# Patient Record
Sex: Female | Born: 1935 | Race: White | Hispanic: No | Marital: Married | Smoking: Never smoker
Health system: Southern US, Community
[De-identification: ages and names within clinical notes are randomized; demographics above are authoritative.]

## PROBLEM LIST (undated history)

## (undated) DIAGNOSIS — Z471 Aftercare following joint replacement surgery: Secondary | ICD-10-CM

## (undated) DIAGNOSIS — R002 Palpitations: Secondary | ICD-10-CM

## (undated) DIAGNOSIS — C4490 Unspecified malignant neoplasm of skin, unspecified: Secondary | ICD-10-CM

## (undated) DIAGNOSIS — E7800 Pure hypercholesterolemia, unspecified: Secondary | ICD-10-CM

## (undated) DIAGNOSIS — Z96652 Presence of left artificial knee joint: Secondary | ICD-10-CM

## (undated) DIAGNOSIS — N200 Calculus of kidney: Secondary | ICD-10-CM

## (undated) DIAGNOSIS — R079 Chest pain, unspecified: Secondary | ICD-10-CM

## (undated) DIAGNOSIS — I10 Essential (primary) hypertension: Secondary | ICD-10-CM

## (undated) DIAGNOSIS — M1990 Unspecified osteoarthritis, unspecified site: Secondary | ICD-10-CM

## (undated) DIAGNOSIS — L570 Actinic keratosis: Secondary | ICD-10-CM

## (undated) DIAGNOSIS — M169 Osteoarthritis of hip, unspecified: Secondary | ICD-10-CM

## (undated) HISTORY — DX: Actinic keratosis: L57.0

## (undated) HISTORY — DX: Palpitations: R00.2

## (undated) HISTORY — PX: APPENDECTOMY: SHX54

## (undated) HISTORY — DX: Aftercare following joint replacement surgery: Z47.1

## (undated) HISTORY — DX: Chest pain, unspecified: R07.9

## (undated) HISTORY — PX: TONSILLECTOMY: SUR1361

## (undated) HISTORY — DX: Pure hypercholesterolemia, unspecified: E78.00

## (undated) HISTORY — PX: SHOULDER ARTHROSCOPY: SHX128

## (undated) HISTORY — DX: Presence of left artificial knee joint: Z96.652

## (undated) HISTORY — PX: TUBAL LIGATION: SHX77

## (undated) HISTORY — PX: ANKLE SURGERY: SHX546

## (undated) HISTORY — DX: Unspecified malignant neoplasm of skin, unspecified: C44.90

---

## 2005-09-28 ENCOUNTER — Ambulatory Visit: Payer: Self-pay | Admitting: Family Medicine

## 2006-05-04 ENCOUNTER — Ambulatory Visit: Payer: Self-pay | Admitting: Unknown Physician Specialty

## 2006-10-01 DIAGNOSIS — C4492 Squamous cell carcinoma of skin, unspecified: Secondary | ICD-10-CM

## 2006-10-01 HISTORY — DX: Squamous cell carcinoma of skin, unspecified: C44.92

## 2006-10-13 ENCOUNTER — Inpatient Hospital Stay: Payer: Self-pay | Admitting: Internal Medicine

## 2006-10-13 ENCOUNTER — Other Ambulatory Visit: Payer: Self-pay

## 2006-11-14 ENCOUNTER — Ambulatory Visit: Payer: Self-pay | Admitting: Unknown Physician Specialty

## 2007-11-19 ENCOUNTER — Ambulatory Visit: Payer: Self-pay | Admitting: Unknown Physician Specialty

## 2007-11-25 ENCOUNTER — Ambulatory Visit: Payer: Self-pay | Admitting: Unknown Physician Specialty

## 2008-01-07 DIAGNOSIS — C44722 Squamous cell carcinoma of skin of right lower limb, including hip: Secondary | ICD-10-CM

## 2008-01-07 HISTORY — DX: Squamous cell carcinoma of skin of right lower limb, including hip: C44.722

## 2008-12-14 ENCOUNTER — Ambulatory Visit: Payer: Self-pay | Admitting: Unknown Physician Specialty

## 2009-01-13 ENCOUNTER — Ambulatory Visit: Payer: Self-pay | Admitting: Unknown Physician Specialty

## 2009-04-02 ENCOUNTER — Ambulatory Visit: Payer: Self-pay | Admitting: Unknown Physician Specialty

## 2009-07-28 ENCOUNTER — Ambulatory Visit: Payer: Self-pay | Admitting: Specialist

## 2010-01-04 ENCOUNTER — Ambulatory Visit: Payer: Self-pay | Admitting: Unknown Physician Specialty

## 2010-04-25 ENCOUNTER — Ambulatory Visit: Payer: Self-pay | Admitting: General Practice

## 2010-05-09 ENCOUNTER — Ambulatory Visit: Payer: Self-pay | Admitting: General Practice

## 2010-05-16 ENCOUNTER — Inpatient Hospital Stay: Payer: Self-pay | Admitting: General Practice

## 2010-05-18 LAB — PATHOLOGY REPORT

## 2010-06-05 ENCOUNTER — Encounter: Payer: Self-pay | Admitting: Internal Medicine

## 2010-06-07 ENCOUNTER — Encounter: Payer: Self-pay | Admitting: Internal Medicine

## 2012-08-26 DIAGNOSIS — C44729 Squamous cell carcinoma of skin of left lower limb, including hip: Secondary | ICD-10-CM

## 2012-08-26 HISTORY — DX: Squamous cell carcinoma of skin of left lower limb, including hip: C44.729

## 2012-11-28 ENCOUNTER — Ambulatory Visit: Payer: Self-pay | Admitting: Unknown Physician Specialty

## 2013-03-13 ENCOUNTER — Ambulatory Visit: Payer: Self-pay | Admitting: Unknown Physician Specialty

## 2013-06-19 DIAGNOSIS — Z471 Aftercare following joint replacement surgery: Secondary | ICD-10-CM

## 2013-06-19 HISTORY — DX: Aftercare following joint replacement surgery: Z47.1

## 2014-02-06 HISTORY — PX: JOINT REPLACEMENT: SHX530

## 2014-04-14 DIAGNOSIS — Z96652 Presence of left artificial knee joint: Secondary | ICD-10-CM

## 2014-04-14 HISTORY — DX: Presence of left artificial knee joint: Z96.652

## 2014-07-07 DIAGNOSIS — E7800 Pure hypercholesterolemia, unspecified: Secondary | ICD-10-CM | POA: Insufficient documentation

## 2014-07-07 HISTORY — DX: Pure hypercholesterolemia, unspecified: E78.00

## 2014-09-12 DIAGNOSIS — L03116 Cellulitis of left lower limb: Secondary | ICD-10-CM | POA: Diagnosis not present

## 2014-09-12 NOTE — ED Notes (Signed)
Pt says she had an area of skin cancer removed on Tuesday from her left lower leg; done by dr Nehemiah Massed; pt says she noticed some redness this evening and started putting and antibiotic cream on it; pt concerned she is going to get MRSA

## 2014-09-13 ENCOUNTER — Emergency Department
Admission: EM | Admit: 2014-09-13 | Discharge: 2014-09-13 | Disposition: A | Discharge status: Home / Self Care | Payer: Medicare Other | Attending: Emergency Medicine | Admitting: Emergency Medicine

## 2014-09-13 DIAGNOSIS — L03116 Cellulitis of left lower limb: Secondary | ICD-10-CM

## 2014-09-13 MED ORDER — CLINDAMYCIN HCL 300 MG PO CAPS: 300.0000 mg | ORAL_CAPSULE | Freq: Four times a day (QID) | ORAL | Status: AC

## 2014-09-13 MED ORDER — CLINDAMYCIN HCL 150 MG PO CAPS
300.0000 mg | ORAL_CAPSULE | Freq: Once | ORAL | Status: AC
Start: 2014-09-13 — End: 2014-09-13
  Administered 2014-09-13: 300 mg via ORAL
  Filled 2014-09-13: qty 2

## 2014-09-13 NOTE — Discharge Instructions (Signed)
Please seek medical attention for any high fevers, chest pain, shortness of breath, change in behavior, persistent vomiting, bloody stool or any other new or concerning symptoms.  Cellulitis Cellulitis is an infection of the skin and the tissue beneath it. The infected area is usually red and tender. Cellulitis occurs most often in the arms and lower legs.  CAUSES  Cellulitis is caused by bacteria that enter the skin through cracks or cuts in the skin. The most common types of bacteria that cause cellulitis are staphylococci and streptococci. SIGNS AND SYMPTOMS   Redness and warmth.  Swelling.  Tenderness or pain.  Fever. DIAGNOSIS  Your health care provider can usually determine what is wrong based on a physical exam. Blood tests may also be done. TREATMENT  Treatment usually involves taking an antibiotic medicine. HOME CARE INSTRUCTIONS   Take your antibiotic medicine as directed by your health care provider. Finish the antibiotic even if you start to feel better.  Keep the infected arm or leg elevated to reduce swelling.  Apply a warm cloth to the affected area up to 4 times per day to relieve pain.  Take medicines only as directed by your health care provider.  Keep all follow-up visits as directed by your health care provider. SEEK MEDICAL CARE IF:   You notice red streaks coming from the infected area.  Your red area gets larger or turns dark in color.  Your bone or joint underneath the infected area becomes painful after the skin has healed.  Your infection returns in the same area or another area.  You notice a swollen bump in the infected area.  You develop new symptoms.  You have a fever. SEEK IMMEDIATE MEDICAL CARE IF:   You feel very sleepy.  You develop vomiting or diarrhea.  You have a general ill feeling (malaise) with muscle aches and pains. MAKE SURE YOU:   Understand these instructions.  Will watch your condition.  Will get help right away  if you are not doing well or get worse. Document Released: 11/02/2004 Document Revised: 06/09/2013 Document Reviewed: 04/10/2011 [facility] Patient Information 2015 [place], [place]. This information is not intended to replace advice given to you by your health care provider. Make sure you discuss any questions you have with your health care provider.

## 2014-09-13 NOTE — ED Notes (Signed)
Patient with no complaints at this time. Respirations even and unlabored. Skin warm/dry. Discharge instructions reviewed with patient at this time. Patient given opportunity to voice concerns/ask questions. Patient discharged at this time and left Emergency Department with steady gait.   

## 2014-09-13 NOTE — ED Provider Notes (Signed)
[facility] Emergency Department Provider Note    ____________________________________________  Time seen: 0232  I have reviewed the triage vital signs and the nursing notes.   HISTORY  Chief Complaint Cellulitis   History limited by: Not Limited   HPI Tiffany Cole is a 81 y.o. female who presents to the emergency department today with concerns for left leg infection. Patient states that she had a skin cancer removed 5 days ago from her left leg. She has been trying topical antibiotics however today noticed some redness and mild discomfort around the site. She states that she has previously had a MRSA infection after a skin cancer removal. Patient is not complaining of any systemic signs such as fever, nausea, vomiting.     No past medical history on file.  There are no active problems to display for this patient.   No past surgical history on file.  No current outpatient prescriptions on file.  Allergies Review of patient's allergies indicates no known allergies.  No family history on file.  Social History No smoking Review of Systems  Constitutional: Negative for fever. Cardiovascular: Negative for chest pain. Respiratory: Negative for shortness of breath. Gastrointestinal: Negative for abdominal pain, vomiting and diarrhea. Genitourinary: Negative for dysuria. Musculoskeletal: Negative for back pain. Skin: Area of redness around the site of skin cancer. Neurological: Negative for headaches, focal weakness or numbness.   10-point ROS otherwise negative.  ____________________________________________   PHYSICAL EXAM:  VITAL SIGNS: ED Triage Vitals  Enc Vitals Group     BP 09/12/14 2343 171/75 mmHg     Pulse Rate 09/12/14 2343 77     Resp 09/12/14 2343 20     Temp 09/12/14 2343 98.6 F (37 C)     Temp Source 09/12/14 2343 Oral     SpO2 09/12/14 2343 98 %     Weight 09/12/14 2343 149 lb (67.586 kg)     Height 09/12/14 2343  5\' 5"  (1.651 m)     Head Cir --      Peak Flow --      Pain Score 09/12/14 2347 2   Constitutional: Alert and oriented. Well appearing and in no distress. Eyes: Conjunctivae are normal. PERRL. Normal extraocular movements. ENT   Head: Normocephalic and atraumatic.   Nose: No congestion/rhinnorhea.   Mouth/Throat: Mucous membranes are moist.   Neck: No stridor. Cardiovascular: Normal rate, regular rhythm.  No murmurs, rubs, or gallops. Respiratory: Normal respiratory effort without tachypnea nor retractions. Breath sounds are clear and equal bilaterally. No wheezes/rales/rhonchi. Genitourinary: Deferred Musculoskeletal: Normal range of motion in all extremities. No joint effusions.  No lower extremity tenderness nor edema. Neurologic:  Normal speech and language. No gross focal neurologic deficits are appreciated. Speech is normal.  Skin:  Skin is warm, dry and intact. Area of erythema and mild tenderness around a site of skin cancer excision on the left shin Psychiatric: Mood and affect are normal. Speech and behavior are normal. Patient exhibits appropriate insight and judgment.  ____________________________________________    LABS (pertinent positives/negatives)  None  ____________________________________________   EKG  None  ____________________________________________    RADIOLOGY  None  ____________________________________________   PROCEDURES  Procedure(s) performed: None  Critical Care performed: No  ____________________________________________   INITIAL IMPRESSION / ASSESSMENT AND PLAN / ED COURSE  Pertinent labs & imaging results that were available during my care of the patient were reviewed by me and considered in my medical decision making (see chart for details).  Patient presents to  the emergency department today with concerns for cellulitis around the side of his skin cancer excision. Exam is consistent with cellulitis. Will place  on antibiotics. Instructed patient follow-up with primary care doctor.  ____________________________________________   FINAL CLINICAL IMPRESSION(S) / ED DIAGNOSES  Final diagnoses:  Cellulitis of left lower extremity     Nance Pear, MD 09/13/14 [phone]

## 2014-09-17 MED ORDER — ACETAMINOPHEN 160 MG/5ML PO SUSP
ORAL | Status: AC
  Filled 2014-09-17: qty 5

## 2014-11-04 ENCOUNTER — Emergency Department
Admission: EM | Admit: 2014-11-04 | Discharge: 2014-11-04 | Disposition: A | Discharge status: Home / Self Care | Payer: Medicare Other | Attending: Emergency Medicine | Admitting: Emergency Medicine

## 2014-11-04 ENCOUNTER — Emergency Department: Payer: Medicare Other

## 2014-11-04 ENCOUNTER — Encounter: Payer: Self-pay | Admitting: Emergency Medicine

## 2014-11-04 DIAGNOSIS — R079 Chest pain, unspecified: Secondary | ICD-10-CM | POA: Diagnosis not present

## 2014-11-04 DIAGNOSIS — R0602 Shortness of breath: Secondary | ICD-10-CM | POA: Insufficient documentation

## 2014-11-04 DIAGNOSIS — R42 Dizziness and giddiness: Secondary | ICD-10-CM | POA: Diagnosis not present

## 2014-11-04 DIAGNOSIS — Z7982 Long term (current) use of aspirin: Secondary | ICD-10-CM | POA: Diagnosis not present

## 2014-11-04 LAB — CBC
HCT: 39.7 % (ref 35.0–47.0)
Hemoglobin: 13.6 g/dL (ref 12.0–16.0)
MCH: 33.1 pg (ref 26.0–34.0)
MCHC: 34.3 g/dL (ref 32.0–36.0)
MCV: 96.6 fL (ref 80.0–100.0)
Platelets: 232 10*3/uL (ref 150–440)
RBC: 4.11 MIL/uL (ref 3.80–5.20)
RDW: 12.8 % (ref 11.5–14.5)
WBC: 6.1 10*3/uL (ref 3.6–11.0)

## 2014-11-04 LAB — BASIC METABOLIC PANEL
Anion gap: 8 (ref 5–15)
BUN: 18 mg/dL (ref 6–20)
CO2: 28 mmol/L (ref 22–32)
Calcium: 9.5 mg/dL (ref 8.9–10.3)
Chloride: 104 mmol/L (ref 101–111)
Creatinine, Ser: 0.85 mg/dL (ref 0.44–1.00)
GFR calc Af Amer: >60 mL/min (ref >60)
GFR calc non Af Amer: >60 mL/min (ref >60)
Glucose, Bld: 106 mg/dL — ABNORMAL HIGH (ref 65–99)
Potassium: 4.1 mmol/L (ref 3.5–5.1)
Sodium: 140 mmol/L (ref 135–145)

## 2014-11-04 LAB — TROPONIN I: Troponin I: <0.03 ng/mL (ref <0.031)

## 2014-11-04 MED ORDER — LORAZEPAM 0.5 MG PO TABS: 0.5000 mg | ORAL_TABLET | Freq: Three times a day (TID) | ORAL | Status: AC | PRN

## 2014-11-04 NOTE — ED Notes (Signed)
Pt reports centralized chest pain today intermittently, reports taking a nitro this morning with some improvement. Pt reports shortness of breath and dizziness with episodes. Pt reports she's been under a lot of stress lately and is unsure if this is anxiety or not. Pt reports appt with Dr Josefa Half in the morning. Pt reports taking 81mg  of aspirin today.

## 2014-11-04 NOTE — Discharge Instructions (Signed)

## 2014-11-04 NOTE — ED Provider Notes (Signed)
[facility] Emergency Department Provider Note     Time seen: ----------------------------------------- 8:22 PM on 11/04/2014 -----------------------------------------    I have reviewed the triage vital signs and the nursing notes.   HISTORY  Chief Complaint Chest Pain and Dizziness    HPI Tiffany Cole is a 81 y.o. female who presents ER for central chest pain today that was intermittent. Patient reports taking her husband nitroglycerin this morning with some improvement perhaps in her symptoms. Associated with chest pain she had some shortness of breath and some dizziness. She notes she's been under lot of stress recently and she is unsure if this is anxiety or not. She does appear a point with her cardiologist tomorrow, she takes 81 mg of aspirin daily.   History reviewed. No pertinent past medical history.  There are no active problems to display for this patient.   Past Surgical History  Procedure Laterality Date  . Tonsillectomy    . Appendectomy    . Joint replacement    . Tubal ligation    . Ankle surgery      Allergies Review of patient's allergies indicates no known allergies.  Social History Social History  Substance Use Topics  . Smoking status: Never Smoker   . Smokeless tobacco: None  . Alcohol Use: No    Review of Systems Constitutional: Negative for fever. Eyes: Negative for visual changes. ENT: Negative for sore throat. Cardiovascular: Positive for chest pain Respiratory: Negative for shortness of breath. Gastrointestinal: Negative for abdominal pain, vomiting and diarrhea. Genitourinary: Negative for dysuria. Musculoskeletal: Negative for back pain. Skin: Negative for rash. Neurological: Negative for headaches, focal weakness or numbness.  10-point ROS otherwise negative.  ____________________________________________   PHYSICAL EXAM:  VITAL SIGNS: ED Triage Vitals  Enc Vitals Group     BP 11/04/14 1830  143/71 mmHg     Pulse Rate 11/04/14 1830 75     Resp 11/04/14 1830 20     Temp 11/04/14 1830 98.1 F (36.7 C)     Temp Source 11/04/14 1830 Oral     SpO2 11/04/14 1830 96 %     Weight 11/04/14 1830 148 lb (67.132 kg)     Height 11/04/14 1830 5\' 5"  (1.651 m)     Head Cir --      Peak Flow --      Pain Score 11/04/14 1832 0     Pain Loc --      Pain Edu? --      Excl. in [place]? --     Constitutional: Alert and oriented. Well appearing and in no distress. Anxious Eyes: Conjunctivae are normal. PERRL. Normal extraocular movements. ENT   Head: Normocephalic and atraumatic.   Nose: No congestion/rhinnorhea.   Mouth/Throat: Mucous membranes are moist.   Neck: No stridor. Cardiovascular: Normal rate, regular rhythm. Normal and symmetric distal pulses are present in all extremities. No murmurs, rubs, or gallops. Respiratory: Normal respiratory effort without tachypnea nor retractions. Breath sounds are clear and equal bilaterally. No wheezes/rales/rhonchi. Gastrointestinal: Soft and nontender. No distention. No abdominal bruits.  Musculoskeletal: Nontender with normal range of motion in all extremities. No joint effusions.  No lower extremity tenderness nor edema. Neurologic:  Normal speech and language. No gross focal neurologic deficits are appreciated. Speech is normal. No gait instability. Skin:  Skin is warm, dry and intact. No rash noted. Psychiatric: Mood and affect are normal. Speech and behavior are normal. Patient exhibits appropriate insight and judgment. Patient does appear anxious ____________________________________________  EKG: Interpreted by me. Sinus rhythm with a rate of 74 bpm, normal PR interval, normal QRS with, normal QT interval. There is evidence of LVH.  ____________________________________________  ED COURSE:  Pertinent labs & imaging results that were available during my care of the patient were reviewed by me and considered in my medical decision  making (see chart for details). Patient appears anxious, we'll check cardiac labs and chest x-ray. ____________________________________________    LABS (pertinent positives/negatives)  Labs Reviewed  BASIC METABOLIC PANEL - Abnormal; Notable for the following:    Glucose, Bld 106 (*)    All other components within normal limits  CBC  TROPONIN I    RADIOLOGY  Chest x-ray is unremarkable  ____________________________________________  FINAL ASSESSMENT AND PLAN  Chest pain  Plan: Patient with labs and imaging as dictated above. Symptoms seem clearly related to recent stress and anxiety. Her labs and x-ray are unremarkable. She is low risk for acute coronary syndrome. She is to see the cardiologist in the morning. We'll prescribe Ativan she can take as needed.   Earleen Newport, MD   Earleen Newport, MD 11/04/14 [phone]

## 2014-11-05 DIAGNOSIS — R079 Chest pain, unspecified: Secondary | ICD-10-CM

## 2014-11-05 HISTORY — DX: Chest pain, unspecified: R07.9

## 2014-12-01 DIAGNOSIS — R002 Palpitations: Secondary | ICD-10-CM

## 2014-12-01 HISTORY — DX: Palpitations: R00.2

## 2015-03-11 ENCOUNTER — Other Ambulatory Visit: Payer: Self-pay | Admitting: Gastroenterology

## 2015-03-11 DIAGNOSIS — R1310 Dysphagia, unspecified: Secondary | ICD-10-CM

## 2015-03-17 ENCOUNTER — Ambulatory Visit
Admission: RE | Admit: 2015-03-17 | Discharge: 2015-03-17 | Disposition: A | Discharge status: Home / Self Care | Payer: Medicare Other | Source: Ambulatory Visit | Attending: Gastroenterology | Admitting: Gastroenterology

## 2015-03-17 DIAGNOSIS — K228 Other specified diseases of esophagus: Secondary | ICD-10-CM | POA: Diagnosis not present

## 2015-03-17 DIAGNOSIS — K449 Diaphragmatic hernia without obstruction or gangrene: Secondary | ICD-10-CM | POA: Insufficient documentation

## 2015-03-17 DIAGNOSIS — R1310 Dysphagia, unspecified: Secondary | ICD-10-CM

## 2015-03-17 DIAGNOSIS — R1314 Dysphagia, pharyngoesophageal phase: Secondary | ICD-10-CM | POA: Insufficient documentation

## 2016-05-23 ENCOUNTER — Encounter: Payer: Self-pay | Admitting: Urology

## 2016-05-23 ENCOUNTER — Ambulatory Visit
Admission: RE | Admit: 2016-05-23 | Discharge: 2016-05-23 | Disposition: A | Discharge status: Home / Self Care | Payer: Medicare Other | Source: Ambulatory Visit | Attending: Urology | Admitting: Urology

## 2016-05-23 ENCOUNTER — Ambulatory Visit (INDEPENDENT_AMBULATORY_CARE_PROVIDER_SITE_OTHER): Payer: Medicare Other | Admitting: Urology

## 2016-05-23 VITALS — BP 92/53 | HR 80 | Ht 65.0 in | Wt 148.0 lb

## 2016-05-23 DIAGNOSIS — R109 Unspecified abdominal pain: Secondary | ICD-10-CM

## 2016-05-23 DIAGNOSIS — N200 Calculus of kidney: Secondary | ICD-10-CM | POA: Insufficient documentation

## 2016-05-23 DIAGNOSIS — N201 Calculus of ureter: Secondary | ICD-10-CM | POA: Diagnosis not present

## 2016-05-23 LAB — URINALYSIS, COMPLETE
Bilirubin, UA: NEGATIVE
Glucose, UA: NEGATIVE
Nitrite, UA: NEGATIVE
Specific Gravity, UA: 1.02 (ref 1.005–1.030)
Urobilinogen, Ur: 0.2 mg/dL (ref 0.2–1.0)
pH, UA: 5.5 (ref 5.0–7.5)

## 2016-05-23 LAB — MICROSCOPIC EXAMINATION: RBC, UA: >30 /hpf — ABNORMAL HIGH (ref 0–?)

## 2016-05-23 NOTE — Progress Notes (Signed)
05/23/2016 8:00 AM   Tiffany Cole May 02, 1928 [id number]  Referring provider: Dion Body, MD [name], [name] [address]  Cc: right flank pain  HPI: 83 year old actually healthy female who presents today for further evaluation of right flank pain. She has a personal history of recurrent nephrolithiasis and passes about a stone present year spontaneously. Today, she completed that she's had intermittent right flank pain since January along with episodic gross hematuria. She is certain that she is passing a stone.  She reports that in general, she is able to pass the stone within a few days to weeks. This particular stone is unusual in that it is not passing spontaneously. She has tried using a Production assistant, radio, pounding on her back, and other maneuvers to try to get the stone to pass.  She reports that she passes a stone about once her year.  She was previously followed by Dr. Ernst Spell.  She has has never had a stone analysis.  She was set up for surgery once, but ended up passing the stone spontaneously before the procedure.  No fevers but does have chills.   Mild nausea but no vomiting.  No dyusria.    She has not had any recent imaging.  She does admit to drinking lots of sweet tea and chocolate. She does not follow a particular stone diet.   PMH: Past Medical History:  Diagnosis Date  . Aftercare following joint replacement 06/19/2013  . Chest pain with high risk for cardiac etiology 11/05/2014  . Heart palpitations 12/01/2014  . Pure hypercholesterolemia 07/07/2014  . S/P left unicompartmental knee replacement 04/14/2014  . Skin cancer     Surgical History: Past Surgical History:  Procedure Laterality Date  . ANKLE SURGERY    . APPENDECTOMY    . JOINT REPLACEMENT    . TONSILLECTOMY    . TUBAL LIGATION      Home Medications:  Allergies as of 05/23/2016      Reactions   Diazepam Anaphylaxis   Stops breathing      Medication List    as of 05/23/2016 11:59 PM   You have not been prescribed any medications.     Allergies:  Allergies  Allergen Reactions  . Diazepam Anaphylaxis    Stops breathing    Family History: Family History  Problem Relation Age of Onset  . Prostate cancer Brother   . Kidney cancer Neg Hx   . Bladder Cancer Neg Hx     Social History:  reports that she has never smoked. She has never used smokeless tobacco. She reports that she does not drink alcohol. Her drug history is not on file.  ROS: UROLOGY Frequent Urination?: Yes Hard to postpone urination?: No Burning/pain with urination?: No Get up at night to urinate?: Yes Leakage of urine?: No Urine stream starts and stops?: No Trouble starting stream?: No Do you have to strain to urinate?: No Blood in urine?: Yes Urinary tract infection?: No Sexually transmitted disease?: No Injury to kidneys or bladder?: No Painful intercourse?: No Weak stream?: No Currently pregnant?: No Vaginal bleeding?: No Last menstrual period?: n  Gastrointestinal Nausea?: Yes Vomiting?: No Indigestion/heartburn?: No Diarrhea?: No Constipation?: No  Constitutional Fever: No Night sweats?: No Weight loss?: No Fatigue?: No  Skin Skin rash/lesions?: No Itching?: No  Eyes Blurred vision?: No Double vision?: No  Ears/Nose/Throat Sore throat?: No Sinus problems?: Yes  Hematologic/Lymphatic Swollen glands?: No Easy bruising?: No  Cardiovascular Leg swelling?: No Chest pain?: No  Respiratory Cough?: No Shortness of breath?: No  Endocrine Excessive thirst?: No  Musculoskeletal Back pain?: Yes Joint pain?: No  Neurological Headaches?: No Dizziness?: No  Psychologic Depression?: No Anxiety?: No  Physical Exam: BP (!) 92/53   Pulse 80   Ht 5\' 5"  (1.651 m)   Wt 148 lb (67.1 kg)   BMI 24.63 kg/m   Constitutional:  Alert and oriented, No acute distress. HEENT: [place] AT, moist mucus membranes.  Trachea midline, no  masses. Cardiovascular: No clubbing, cyanosis, or edema. Respiratory: Normal respiratory effort, no increased work of breathing. GI: Abdomen is soft, nontender, nondistended, no abdominal masses GU: Mild right CVA tenderness Skin: No rashes, bruises or suspicious lesions. Neurologic: Grossly intact, no focal deficits, moving all 4 extremities. Psychiatric: Normal mood and affect.  Laboratory Data: Lab Results  Component Value Date   WBC 6.1 11/04/2014   HGB 13.6 11/04/2014   HCT 39.7 11/04/2014   MCV 96.6 11/04/2014   PLT 232 11/04/2014    Lab Results  Component Value Date   CREATININE 0.85 11/04/2014   Urinalysis Results for orders placed or performed in visit on 05/23/16  Microscopic Examination  Result Value Ref Range   WBC, UA 11-30 (A) 0 - 5 /hpf   RBC, UA >30 (H) 0 - 2 /hpf   Epithelial Cells (non renal) 0-10 0 - 10 /hpf   Crystals Present (A) N/A   Crystal Type Amorphous Sediment N/A   Mucus, UA Present (A) Not Estab.   Bacteria, UA Moderate (A) None seen/Few  Urinalysis, Complete  Result Value Ref Range   Specific Gravity, UA 1.020 1.005 - 1.030   pH, UA 5.5 5.0 - 7.5   Color, UA Yellow Yellow   Appearance Ur Brown (A) Clear   Leukocytes, UA 1+ (A) Negative   Protein, UA 2+ (A) Negative/Trace   Glucose, UA Negative Negative   Ketones, UA 1+ (A) Negative   RBC, UA 3+ (A) Negative   Bilirubin, UA Negative Negative   Urobilinogen, Ur 0.2 0.2 - 1.0 mg/dL   Nitrite, UA Negative Negative   Microscopic Examination See below:     Pertinent Imaging: CLINICAL DATA:  Right posterior flank pain. Blood in the urine for 2 weeks. Fever and chills. History of kidney stones.  EXAM: ABDOMEN - 1 VIEW  COMPARISON:  CT of the abdomen and pelvis 05/04/2006  FINDINGS: 7 mm calcification demonstrated to the right of L3, likely representing a stone in the proximal right ureter. Vague calcifications projected over the left kidney may represent left intrarenal stones.  Bowel gas pattern is normal with scattered gas and stool throughout the colon. Degenerative changes in the lumbar spine and left hip. Previous right hip arthroplasty.  IMPRESSION: 7 mm stone likely in the proximal right ureter. Nonobstructive bowel gas pattern.   Electronically Signed   By: Lucienne Capers M.D.   On: 05/24/2016 02:00  KUB reviewed personally today  Assessment & Plan:    1. Right ureteral calculus 7 mm presumably obstructing right UPJ stone on KUB today  She's been unable to pass the stone since January, I have recommended surgical intervention for this.  We discussed various treatment options including ESWL vs. ureteroscopy, laser lithotripsy, and stent. We discussed the risks and benefits of both including bleeding, infection, damage to surrounding structures, efficacy with need for possible further intervention, and need for temporary ureteral stent.  She is most interested in shockwave lithotripsy. She is excellent candidate for this. We'll arrange for this on Thursday.  Do not suspect infection but we will go ahead and treat with antibiotics as a precaution periprocedurally while awaiting culture results.  - Urinalysis, Complete - Abdomen 1 view (KUB) -- CULTURE, URINE COMPREHENSIVE  2. Recurrent nephrolithiasis Encouraged hydration We'll discuss stone prevention techniques further after stone treatment  3. Right flank pain Secondary to #1    Return for schedule right ESWL.  Hollice Espy, MD  [facility] [address] [place], [place] [address] [phone]

## 2016-05-24 ENCOUNTER — Encounter
Admission: RE | Admit: 2016-05-24 | Discharge: 2016-05-24 | Disposition: A | Discharge status: Home / Self Care | Payer: Medicare Other | Source: Ambulatory Visit | Attending: Urology | Admitting: Urology

## 2016-05-24 ENCOUNTER — Encounter: Payer: Self-pay | Admitting: *Deleted

## 2016-05-24 ENCOUNTER — Telehealth: Payer: Self-pay | Admitting: Radiology

## 2016-05-24 ENCOUNTER — Other Ambulatory Visit: Payer: Self-pay | Admitting: Radiology

## 2016-05-24 DIAGNOSIS — N201 Calculus of ureter: Secondary | ICD-10-CM | POA: Diagnosis present

## 2016-05-24 DIAGNOSIS — Z85828 Personal history of other malignant neoplasm of skin: Secondary | ICD-10-CM | POA: Diagnosis not present

## 2016-05-24 DIAGNOSIS — Z87442 Personal history of urinary calculi: Secondary | ICD-10-CM | POA: Diagnosis not present

## 2016-05-24 DIAGNOSIS — Z96652 Presence of left artificial knee joint: Secondary | ICD-10-CM | POA: Diagnosis not present

## 2016-05-24 MED ORDER — CIPROFLOXACIN HCL 500 MG PO TABS: 500.0000 mg | ORAL_TABLET | Freq: Two times a day (BID) | ORAL | 0 refills | Status: DC

## 2016-05-24 NOTE — Telephone Encounter (Signed)
-----   Message from Hollice Espy, MD sent at 05/24/2016  8:05 AM EDT ----- This patient is can come in today at 10 AM to follow lithotripsy paperwork with you for tomorrow. I could start antibiotics and given 2 doses today as a precaution.

## 2016-05-24 NOTE — Telephone Encounter (Signed)
Pt present in office to fill out lithotripsy paperwork. Advised pt of script sent to pharmacy & to take 2 doses today.

## 2016-05-25 ENCOUNTER — Encounter
Admission: RE | Disposition: A | Discharge status: Home / Self Care | Payer: Self-pay | Source: Ambulatory Visit | Attending: Urology

## 2016-05-25 ENCOUNTER — Ambulatory Visit
Admission: RE | Admit: 2016-05-25 | Discharge: 2016-05-25 | Disposition: A | Discharge status: Home / Self Care | Payer: Medicare Other | Source: Ambulatory Visit | Attending: Urology | Admitting: Urology

## 2016-05-25 ENCOUNTER — Encounter: Payer: Self-pay | Admitting: *Deleted

## 2016-05-25 ENCOUNTER — Telehealth: Payer: Self-pay | Admitting: Urology

## 2016-05-25 DIAGNOSIS — Z87442 Personal history of urinary calculi: Secondary | ICD-10-CM | POA: Insufficient documentation

## 2016-05-25 DIAGNOSIS — N201 Calculus of ureter: Secondary | ICD-10-CM

## 2016-05-25 DIAGNOSIS — Z85828 Personal history of other malignant neoplasm of skin: Secondary | ICD-10-CM | POA: Insufficient documentation

## 2016-05-25 DIAGNOSIS — Z96652 Presence of left artificial knee joint: Secondary | ICD-10-CM | POA: Insufficient documentation

## 2016-05-25 HISTORY — PX: EXTRACORPOREAL SHOCK WAVE LITHOTRIPSY: SHX1557

## 2016-05-25 SURGERY — LITHOTRIPSY, ESWL
Anesthesia: Moderate Sedation | Laterality: Right

## 2016-05-25 MED ORDER — CIPROFLOXACIN HCL 500 MG PO TABS
500.0000 mg | ORAL_TABLET | ORAL | Status: DC
Start: 2016-05-25 — End: 2016-05-25

## 2016-05-25 MED ORDER — DIPHENHYDRAMINE HCL 25 MG PO CAPS
ORAL_CAPSULE | ORAL | Status: AC
  Filled 2016-05-25: qty 1

## 2016-05-25 MED ORDER — DOCUSATE SODIUM 100 MG PO CAPS: 100.0000 mg | ORAL_CAPSULE | Freq: Two times a day (BID) | ORAL | 0 refills | Status: DC

## 2016-05-25 MED ORDER — DIPHENHYDRAMINE HCL 25 MG PO TABS
12.5000 mg | ORAL_TABLET | Freq: Once | ORAL | Status: AC
  Administered 2016-05-25: 12.5 mg via ORAL
  Filled 2016-05-25: qty 0.5

## 2016-05-25 MED ORDER — TAMSULOSIN HCL 0.4 MG PO CAPS: 0.4000 mg | ORAL_CAPSULE | Freq: Every day | ORAL | 0 refills | Status: DC

## 2016-05-25 MED ORDER — CIPROFLOXACIN HCL 500 MG PO TABS
ORAL_TABLET | ORAL | Status: AC
  Filled 2016-05-25: qty 1

## 2016-05-25 MED ORDER — DEXTROSE-NACL 5-0.45 % IV SOLN
INTRAVENOUS | Status: DC
Start: 2016-05-25 — End: 2016-05-25
  Administered 2016-05-25: 08:00:00 via INTRAVENOUS

## 2016-05-25 MED ORDER — HYDROCODONE-ACETAMINOPHEN 5-325 MG PO TABS: 1.0000 | ORAL_TABLET | Freq: Four times a day (QID) | ORAL | 0 refills | Status: DC | PRN

## 2016-05-25 NOTE — H&P (View-Only) (Signed)
05/23/2016 8:00 AM   Tiffany Cole [birth year] [id number]  Referring provider: Dion Body, MD [place], [place] [address]  Cc: right flank pain  HPI: 83 year old actually healthy female who presents today for further evaluation of right flank pain. She has a personal history of recurrent nephrolithiasis and passes about a stone present year spontaneously. Today, she completed that she's had intermittent right flank pain since January along with episodic gross hematuria. She is certain that she is passing a stone.  She reports that in general, she is able to pass the stone within a few days to weeks. This particular stone is unusual in that it is not passing spontaneously. She has tried using a Production assistant, radio, pounding on her back, and other maneuvers to try to get the stone to pass.  She reports that she passes a stone about once her year.  She was previously followed by Dr. Ernst Spell.  She has has never had a stone analysis.  She was set up for surgery once, but ended up passing the stone spontaneously before the procedure.  No fevers but does have chills.   Mild nausea but no vomiting.  No dyusria.    She has not had any recent imaging.  She does admit to drinking lots of sweet tea and chocolate. She does not follow a particular stone diet.   PMH: Past Medical History:  Diagnosis Date  . Aftercare following joint replacement 06/19/2013  . Chest pain with high risk for cardiac etiology 11/05/2014  . Heart palpitations 12/01/2014  . Pure hypercholesterolemia 07/07/2014  . S/P left unicompartmental knee replacement 04/14/2014  . Skin cancer     Surgical History: Past Surgical History:  Procedure Laterality Date  . ANKLE SURGERY    . APPENDECTOMY    . JOINT REPLACEMENT    . TONSILLECTOMY    . TUBAL LIGATION      Home Medications:  Allergies as of 05/23/2016      Reactions   Diazepam Anaphylaxis   Stops breathing      Medication List    as of 05/23/2016 11:59 PM   You have not been prescribed any medications.     Allergies:  Allergies  Allergen Reactions  . Diazepam Anaphylaxis    Stops breathing    Family History: Family History  Problem Relation Age of Onset  . Prostate cancer Brother   . Kidney cancer Neg Hx   . Bladder Cancer Neg Hx     Social History:  reports that she has never smoked. She has never used smokeless tobacco. She reports that she does not drink alcohol. Her drug history is not on file.  ROS: UROLOGY Frequent Urination?: Yes Hard to postpone urination?: No Burning/pain with urination?: No Get up at night to urinate?: Yes Leakage of urine?: No Urine stream starts and stops?: No Trouble starting stream?: No Do you have to strain to urinate?: No Blood in urine?: Yes Urinary tract infection?: No Sexually transmitted disease?: No Injury to kidneys or bladder?: No Painful intercourse?: No Weak stream?: No Currently pregnant?: No Vaginal bleeding?: No Last menstrual period?: n  Gastrointestinal Nausea?: Yes Vomiting?: No Indigestion/heartburn?: No Diarrhea?: No Constipation?: No  Constitutional Fever: No Night sweats?: No Weight loss?: No Fatigue?: No  Skin Skin rash/lesions?: No Itching?: No  Eyes Blurred vision?: No Double vision?: No  Ears/Nose/Throat Sore throat?: No Sinus problems?: Yes  Hematologic/Lymphatic Swollen glands?: No Easy bruising?: No  Cardiovascular Leg swelling?: No Chest pain?: No  Respiratory Cough?: No Shortness of breath?: No  Endocrine Excessive thirst?: No  Musculoskeletal Back pain?: Yes Joint pain?: No  Neurological Headaches?: No Dizziness?: No  Psychologic Depression?: No Anxiety?: No  Physical Exam: BP (!) 92/53   Pulse 80   Ht 5\' 5"  (1.651 m)   Wt 148 lb (67.1 kg)   BMI 24.63 kg/m   Constitutional:  Alert and oriented, No acute distress. HEENT: [place] AT, moist mucus membranes.  Trachea midline, no  masses. Cardiovascular: No clubbing, cyanosis, or edema. Respiratory: Normal respiratory effort, no increased work of breathing. GI: Abdomen is soft, nontender, nondistended, no abdominal masses GU: Mild right CVA tenderness Skin: No rashes, bruises or suspicious lesions. Neurologic: Grossly intact, no focal deficits, moving all 4 extremities. Psychiatric: Normal mood and affect.  Laboratory Data: Lab Results  Component Value Date   WBC 6.1 11/04/2014   HGB 13.6 11/04/2014   HCT 39.7 11/04/2014   MCV 96.6 11/04/2014   PLT 232 11/04/2014    Lab Results  Component Value Date   CREATININE 0.85 11/04/2014   Urinalysis Results for orders placed or performed in visit on 05/23/16  Microscopic Examination  Result Value Ref Range   WBC, UA 11-30 (A) 0 - 5 /hpf   RBC, UA >30 (H) 0 - 2 /hpf   Epithelial Cells (non renal) 0-10 0 - 10 /hpf   Crystals Present (A) N/A   Crystal Type Amorphous Sediment N/A   Mucus, UA Present (A) Not Estab.   Bacteria, UA Moderate (A) None seen/Few  Urinalysis, Complete  Result Value Ref Range   Specific Gravity, UA 1.020 1.005 - 1.030   pH, UA 5.5 5.0 - 7.5   Color, UA Yellow Yellow   Appearance Ur Brown (A) Clear   Leukocytes, UA 1+ (A) Negative   Protein, UA 2+ (A) Negative/Trace   Glucose, UA Negative Negative   Ketones, UA 1+ (A) Negative   RBC, UA 3+ (A) Negative   Bilirubin, UA Negative Negative   Urobilinogen, Ur 0.2 0.2 - 1.0 mg/dL   Nitrite, UA Negative Negative   Microscopic Examination See below:     Pertinent Imaging: CLINICAL DATA:  Right posterior flank pain. Blood in the urine for 2 weeks. Fever and chills. History of kidney stones.  EXAM: ABDOMEN - 1 VIEW  COMPARISON:  CT of the abdomen and pelvis 05/04/2006  FINDINGS: 7 mm calcification demonstrated to the right of L3, likely representing a stone in the proximal right ureter. Vague calcifications projected over the left kidney may represent left intrarenal stones.  Bowel gas pattern is normal with scattered gas and stool throughout the colon. Degenerative changes in the lumbar spine and left hip. Previous right hip arthroplasty.  IMPRESSION: 7 mm stone likely in the proximal right ureter. Nonobstructive bowel gas pattern.   Electronically Signed   By: Lucienne Capers M.D.   On: 05/24/2016 02:00  KUB reviewed personally today  Assessment & Plan:    1. Right ureteral calculus 7 mm presumably obstructing right UPJ stone on KUB today  She's been unable to pass the stone since January, I have recommended surgical intervention for this.  We discussed various treatment options including ESWL vs. ureteroscopy, laser lithotripsy, and stent. We discussed the risks and benefits of both including bleeding, infection, damage to surrounding structures, efficacy with need for possible further intervention, and need for temporary ureteral stent.  She is most interested in shockwave lithotripsy. She is excellent candidate for this. We'll arrange for this on Thursday.  Do not suspect infection but we will go ahead and treat with antibiotics as a precaution periprocedurally while awaiting culture results.  - Urinalysis, Complete - Abdomen 1 view (KUB) -- CULTURE, URINE COMPREHENSIVE  2. Recurrent nephrolithiasis Encouraged hydration We'll discuss stone prevention techniques further after stone treatment  3. Right flank pain Secondary to #1    Return for schedule right ESWL.  Hollice Espy, MD  [facility] [address], [place], [place] [address] [phone]

## 2016-05-25 NOTE — Telephone Encounter (Signed)
Would you mail the script to [facility] for the Norco?  I gave a verbal order tonight for the medication, but the pharmacy needs the hardcopy.

## 2016-05-25 NOTE — OR Nursing (Signed)
Upon arrival to post op a reddened area with .5cm broken skin noted on right flank.  Encouraged ice to area and keep clean with warm soap and water.

## 2016-05-25 NOTE — Discharge Instructions (Signed)
See [facility] discharge instructions in chart.  AMBULATORY SURGERY  DISCHARGE INSTRUCTIONS   1) The drugs that you were given will stay in your system until tomorrow so for the next 24 hours you should not:  A) Drive an automobile B) Make any legal decisions C) Drink any alcoholic beverage   2) You may resume regular meals tomorrow.  Today it is better to start with liquids and gradually work up to solid foods.  You may eat anything you prefer, but it is better to start with liquids, then soup and crackers, and gradually work up to solid foods.   3) Please notify your doctor immediately if you have any unusual bleeding, trouble breathing, redness and pain at the surgery site, drainage, fever, or pain not relieved by medication.    4) Additional Instructions:        Please contact your physician with any problems or Same Day Surgery at [phone], Monday through Friday 6 am to 4 pm, or [place] [facility] [place] Main number at [phone].  

## 2016-05-25 NOTE — OR Nursing (Signed)
Pt had cipro at home this am 0510 so cipro cx in epic

## 2016-05-25 NOTE — Interval H&P Note (Signed)
History and Physical Interval Note:  05/25/2016 7:58 AM  Tiffany Cole  has presented today for surgery, with the diagnosis of Kidney Stone  The various methods of treatment have been discussed with the patient and family. After consideration of risks, benefits and other options for treatment, the patient has consented to  Procedure(s): EXTRACORPOREAL SHOCK WAVE LITHOTRIPSY (ESWL) (Right) as a surgical intervention .  The patient's history has been reviewed, patient examined, no change in status, stable for surgery.  I have reviewed the patient's chart and labs.  Questions were answered to the patient's satisfaction.    RRR CTAB   Hollice Espy

## 2016-05-26 ENCOUNTER — Encounter: Payer: Self-pay | Admitting: Urology

## 2016-05-26 LAB — CULTURE, URINE COMPREHENSIVE

## 2016-05-26 MED ORDER — HYDROCODONE-ACETAMINOPHEN 5-325 MG PO TABS: 1.0000 | ORAL_TABLET | Freq: Four times a day (QID) | ORAL | 0 refills | Status: DC | PRN

## 2016-05-26 NOTE — Telephone Encounter (Signed)
Dr. Pilar Jarvis signed the script and script was faxed to pharmacy.

## 2016-06-08 ENCOUNTER — Ambulatory Visit
Admission: RE | Admit: 2016-06-08 | Discharge: 2016-06-08 | Disposition: A | Discharge status: Home / Self Care | Payer: Medicare Other | Source: Ambulatory Visit | Attending: Urology | Admitting: Urology

## 2016-06-08 ENCOUNTER — Ambulatory Visit (INDEPENDENT_AMBULATORY_CARE_PROVIDER_SITE_OTHER): Payer: Medicare Other | Admitting: Urology

## 2016-06-08 ENCOUNTER — Encounter: Payer: Self-pay | Admitting: Urology

## 2016-06-08 VITALS — BP 108/71 | HR 65 | Ht 65.0 in | Wt 146.0 lb

## 2016-06-08 DIAGNOSIS — N201 Calculus of ureter: Secondary | ICD-10-CM

## 2016-06-08 DIAGNOSIS — N200 Calculus of kidney: Secondary | ICD-10-CM | POA: Diagnosis not present

## 2016-06-08 MED ORDER — TAMSULOSIN HCL 0.4 MG PO CAPS: 0.4000 mg | ORAL_CAPSULE | Freq: Every day | ORAL | 0 refills | Status: DC

## 2016-06-08 NOTE — Progress Notes (Signed)
06/08/2016 4:09 PM   Leasha J Jozwiak 05-Feb-1929 [id number]  Referring provider: Dion Body, MD [place], [place] [address]  Cc: s/p eswl  HPI: 83 year old healthy female with a 7 mm right proximal ureteral stone and flank pain times several months. Most recently, she underwent shockwave lithotripsy  on 05/25/2016 which was well tolerated.  She is passed several stone fragments which she brings with her today. She denies any flank pain but does have some soreness on her skin overlying the shockwave head site.  No nausea or vomiting. She did have some discomfort in her right lower quadrant over the past few days along with an episode of gross hematuria which has resolved. No dysuria or urinary symptoms.  KUB today shows excellent fragmentation of the stones with a few small punctate residual fragments.  She reports that she passes a stone about once her year.  She was previously followed by Dr. Ernst Spell.  She has has never had a stone analysis.    She does admit to drinking lots of sweet tea and chocolate. She does not follow a particular stone diet.   PMH: Past Medical History:  Diagnosis Date  . Aftercare following joint replacement 06/19/2013  . Chest pain with high risk for cardiac etiology 11/05/2014  . Heart palpitations 12/01/2014  . Pure hypercholesterolemia 07/07/2014  . S/P left unicompartmental knee replacement 04/14/2014  . Skin cancer     Surgical History: Past Surgical History:  Procedure Laterality Date  . ANKLE SURGERY    . APPENDECTOMY    . EXTRACORPOREAL SHOCK WAVE LITHOTRIPSY Right 05/25/2016   Procedure: EXTRACORPOREAL SHOCK WAVE LITHOTRIPSY (ESWL);  Surgeon: Hollice Espy, MD;  Location: [facility] ORS;  Service: Urology;  Laterality: Right;  . JOINT REPLACEMENT Left 2016   knee  . TONSILLECTOMY    . TUBAL LIGATION      Home Medications:  Allergies as of 06/08/2016      Reactions   Diazepam Other (See Comments)   Stops breathing (oversedation)      Medication List       Accurate as of 06/08/16 11:59 PM. Always use your most recent med list.          tamsulosin 0.4 MG Caps capsule Commonly known as:  FLOMAX Take 1 capsule (0.4 mg total) by mouth daily.       Allergies:  Allergies  Allergen Reactions  . Diazepam Other (See Comments)    Stops breathing (oversedation)    Family History: Family History  Problem Relation Age of Onset  . Prostate cancer Brother   . Kidney cancer Neg Hx   . Bladder Cancer Neg Hx     Social History:  reports that she has never smoked. She has never used smokeless tobacco. She reports that she does not drink alcohol or use drugs.  ROS: UROLOGY Frequent Urination?: No Hard to postpone urination?: No Burning/pain with urination?: No Get up at night to urinate?: No Leakage of urine?: Yes Urine stream starts and stops?: No Trouble starting stream?: No Do you have to strain to urinate?: No Blood in urine?: Yes Urinary tract infection?: No Sexually transmitted disease?: No Injury to kidneys or bladder?: No Painful intercourse?: No Weak stream?: No Currently pregnant?: No Vaginal bleeding?: No Last menstrual period?: n  Gastrointestinal Nausea?: No Vomiting?: No Indigestion/heartburn?: No Diarrhea?: No Constipation?: No  Constitutional Fever: No Night sweats?: No Weight loss?: No Fatigue?: No  Skin Skin rash/lesions?: No Itching?: No  Eyes Blurred vision?:  No Double vision?: No  Ears/Nose/Throat Sore throat?: No Sinus problems?: No  Hematologic/Lymphatic Swollen glands?: No Easy bruising?: No  Cardiovascular Leg swelling?: No Chest pain?: No  Respiratory Cough?: No Shortness of breath?: No  Endocrine Excessive thirst?: No  Musculoskeletal Back pain?: No Joint pain?: No  Neurological Headaches?: No Dizziness?: No  Psychologic Depression?: No Anxiety?: No  Physical Exam: BP 108/71   Pulse 65   Ht 5\' 5"   (1.651 m)   Wt 146 lb (66.2 kg)   BMI 24.30 kg/m   Constitutional:  Alert and oriented, No acute distress. HEENT: [place] AT, moist mucus membranes.  Trachea midline, no masses. Cardiovascular: No clubbing, cyanosis, or edema. Respiratory: Normal respiratory effort, no increased work of breathing. GI: Abdomen is soft, nontender, nondistended, no abdominal masses GU: Small partially 1 cm erythematous patch over right flank. Skin: No rashes, bruises or suspicious lesions. Neurologic: Grossly intact, no focal deficits, moving all 4 extremities. Psychiatric: Normal mood and affect.  Laboratory Data: Lab Results  Component Value Date   WBC 6.1 11/04/2014   HGB 13.6 11/04/2014   HCT 39.7 11/04/2014   MCV 96.6 11/04/2014   PLT 232 11/04/2014    Lab Results  Component Value Date   CREATININE 0.85 11/04/2014   Urinalysis n/a  Pertinent Imaging: CLINICAL DATA:  Right ureteral stone  EXAM: ABDOMEN - 1 VIEW  COMPARISON:  05/23/2016  FINDINGS: Three small calcifications in the right abdomen are noted, which may rib represent small stone fragments. The previously seen larger right proximal ureteral stone no longer visualized. Nonobstructive bowel gas pattern.  IMPRESSION: Question small retained right proximal ureteral stone fragments.   Electronically Signed   By: Rolm Baptise M.D.   On: 06/08/2016 10:29  KUB reviewed personally today  Assessment & Plan:    1. Right ureteral calculus s/p successful right ESWL two weeks ago, multiple proximal ureteral fragments measuring 1 mm or less  Expected management for the small fragments, repeat KUB in 3 weeks  Voiding symptoms are reviewed again today with the patient including indications for urgent evaluation.  2. Recurrent nephrolithiasis We discussed general stone prevention techniques including drinking plenty water with goal of producing 2.5 L urine daily, increased citric acid intake, avoidance of high oxalate  containing foods, and decreased salt intake.  Information about dietary recommendations given today.   Stone analysis ordered today   Return in about 3 weeks (around 06/29/2016) for KUB.  Hollice Espy, MD  [facility] [address] [place], [place] [address] [phone]

## 2016-06-14 ENCOUNTER — Other Ambulatory Visit: Payer: Self-pay | Admitting: Urology

## 2016-06-30 ENCOUNTER — Ambulatory Visit: Payer: Medicare Other | Admitting: Urology

## 2016-07-11 ENCOUNTER — Ambulatory Visit (INDEPENDENT_AMBULATORY_CARE_PROVIDER_SITE_OTHER): Payer: Medicare Other | Admitting: Urology

## 2016-07-11 ENCOUNTER — Ambulatory Visit
Admission: RE | Admit: 2016-07-11 | Discharge: 2016-07-11 | Disposition: A | Discharge status: Home / Self Care | Payer: Medicare Other | Source: Ambulatory Visit | Attending: Urology | Admitting: Urology

## 2016-07-11 ENCOUNTER — Telehealth: Payer: Self-pay | Admitting: Urology

## 2016-07-11 ENCOUNTER — Telehealth: Payer: Self-pay

## 2016-07-11 VITALS — BP 123/70 | HR 83 | Ht 65.0 in | Wt 141.2 lb

## 2016-07-11 DIAGNOSIS — R350 Frequency of micturition: Secondary | ICD-10-CM | POA: Diagnosis not present

## 2016-07-11 DIAGNOSIS — N201 Calculus of ureter: Secondary | ICD-10-CM

## 2016-07-11 DIAGNOSIS — R300 Dysuria: Secondary | ICD-10-CM

## 2016-07-11 DIAGNOSIS — N200 Calculus of kidney: Secondary | ICD-10-CM

## 2016-07-11 LAB — URINALYSIS, COMPLETE
Bilirubin, UA: NEGATIVE
Glucose, UA: NEGATIVE
Ketones, UA: NEGATIVE
Leukocytes, UA: NEGATIVE
Nitrite, UA: NEGATIVE
Protein, UA: NEGATIVE
Specific Gravity, UA: 1.025 (ref 1.005–1.030)
Urobilinogen, Ur: 0.2 mg/dL (ref 0.2–1.0)
pH, UA: 5.5 (ref 5.0–7.5)

## 2016-07-11 LAB — BLADDER SCAN AMB NON-IMAGING: Scan Result: 0

## 2016-07-11 LAB — MICROSCOPIC EXAMINATION: Bacteria, UA: NONE SEEN

## 2016-07-11 MED ORDER — TAMSULOSIN HCL 0.4 MG PO CAPS: 0.4000 mg | ORAL_CAPSULE | Freq: Every day | ORAL | 0 refills | Status: DC

## 2016-07-11 MED ORDER — OXYBUTYNIN CHLORIDE 5 MG PO TABS: 5.0000 mg | ORAL_TABLET | Freq: Three times a day (TID) | ORAL | 0 refills | Status: DC | PRN

## 2016-07-11 NOTE — Telephone Encounter (Signed)
Spoke with patient she feels like she may have a UTI or stone fragment stuck in urethra, symptoms include back pain, frequency, dysuria, and straining to urinate. Patient was told to go for x-ray and come to our office after for a nurse visit UA and bladder scan. Will review results with Dr. Erlene Quan. Patient verbalized agreement with this plan.

## 2016-07-11 NOTE — Progress Notes (Signed)
07/11/2016 2:57 PM   Tiffany Cole 1928/12/09 [id number]  Referring provider: Dion Body, MD [place] [facility] [place], [place] [address]  Chief Complaint  Patient presents with  . Urinary Frequency    HPI:  83 year old healthy female with a 7 mm right proximal ureteral stone and flank pain times several months. Most recently, she underwent shockwave lithotripsy  on 05/25/2016 which was well tolerated.  She is passed several stone fragments she brought with her to her follow-up appointment but had several small fragments residual within the right ureter at 2 weeks postop.  Over the past week or so, she's had a stabbing like pain in her urethra particularly with voiding. She also has urinary urgency and frequency. She was concerned that she may have a urinary tract infection or that something was large at her urethral meatus. She's been trying to drink plenty water. She tried her to help flush her system which was not affected.  No nausea or vomiting. No gross hematuria.  UA today is unremarkable other than 10-30 red blood cells per high-power field. No evidence of infection.   PMH: Past Medical History:  Diagnosis Date  . Aftercare following joint replacement 06/19/2013  . Chest pain with high risk for cardiac etiology 11/05/2014  . Heart palpitations 12/01/2014  . Pure hypercholesterolemia 07/07/2014  . S/P left unicompartmental knee replacement 04/14/2014  . Skin cancer     Surgical History: Past Surgical History:  Procedure Laterality Date  . ANKLE SURGERY    . APPENDECTOMY    . EXTRACORPOREAL SHOCK WAVE LITHOTRIPSY Right 05/25/2016   Procedure: EXTRACORPOREAL SHOCK WAVE LITHOTRIPSY (ESWL);  Surgeon: Hollice Espy, MD;  Location: [facility] ORS;  Service: Urology;  Laterality: Right;  . JOINT REPLACEMENT Left 2016   knee  . TONSILLECTOMY    . TUBAL LIGATION      Home Medications:  Allergies as of 07/11/2016      Reactions   Diazepam Other  (See Comments)   Stops breathing (oversedation)      Medication List       Accurate as of 07/11/16  2:57 PM. Always use your most recent med list.          oxybutynin 5 MG tablet Commonly known as:  DITROPAN Take 1 tablet (5 mg total) by mouth every 8 (eight) hours as needed for bladder spasms.   tamsulosin 0.4 MG Caps capsule Commonly known as:  FLOMAX Take 1 capsule (0.4 mg total) by mouth daily.   tamsulosin 0.4 MG Caps capsule Commonly known as:  FLOMAX Take 1 capsule (0.4 mg total) by mouth daily.       Allergies:  Allergies  Allergen Reactions  . Diazepam Other (See Comments)    Stops breathing (oversedation)    Family History: Family History  Problem Relation Age of Onset  . Prostate cancer Brother   . Kidney cancer Neg Hx   . Bladder Cancer Neg Hx     Social History:  reports that she has never smoked. She has never used smokeless tobacco. She reports that she does not drink alcohol or use drugs.  ROS: UROLOGY Frequent Urination?: Yes Hard to postpone urination?: No Burning/pain with urination?: No Get up at night to urinate?: Yes Leakage of urine?: No Urine stream starts and stops?: No Trouble starting stream?: No Do you have to strain to urinate?: No Blood in urine?: Yes Urinary tract infection?: No Sexually transmitted disease?: No Injury to kidneys or bladder?: No Painful intercourse?: No  Weak stream?: No Currently pregnant?: No Vaginal bleeding?: No Last menstrual period?: n  Gastrointestinal Nausea?: No Vomiting?: No Indigestion/heartburn?: No Diarrhea?: No Constipation?: No  Constitutional Fever: No Night sweats?: No Weight loss?: No Fatigue?: No  Skin Skin rash/lesions?: No Itching?: No  Eyes Blurred vision?: No Double vision?: No  Ears/Nose/Throat Sore throat?: No Sinus problems?: Yes  Hematologic/Lymphatic Swollen glands?: No Easy bruising?: No  Cardiovascular Leg swelling?: No Chest pain?:  No  Respiratory Cough?: No Shortness of breath?: No  Endocrine Excessive thirst?: No  Musculoskeletal Back pain?: No Joint pain?: No  Neurological Headaches?: No Dizziness?: No  Psychologic Depression?: No Anxiety?: No  Physical Exam: BP 123/70 (BP Location: Left Arm, Patient Position: Sitting, Cuff Size: Normal)   Pulse 83   Ht 5\' 5"  (1.651 m)   Wt 141 lb 3.2 oz (64 kg)   BMI 23.50 kg/m   Constitutional:  Alert and oriented, No acute distress. HEENT: [place] AT, moist mucus membranes.  Trachea midline, no masses. Cardiovascular: No clubbing, cyanosis, or edema. Respiratory: Normal respiratory effort, no increased work of breathing. GI: Abdomen is soft, nontender, nondistended, no abdominal masses GU: No CVA tenderness.  Skin: No rashes, bruises or suspicious lesions. Neurologic: Grossly intact, no focal deficits, moving all 4 extremities. Psychiatric: Normal mood and affect.  Laboratory Data: Lab Results  Component Value Date   WBC 6.1 11/04/2014   HGB 13.6 11/04/2014   HCT 39.7 11/04/2014   MCV 96.6 11/04/2014   PLT 232 11/04/2014    Lab Results  Component Value Date   CREATININE 0.85 11/04/2014    Urinalysis UA today negative, 11-30 red blood cells per high-powered field, otherwise no leukocytes or nitrites. See Epic for details.  Pertinent Imaging: CLINICAL DATA:  Kidney stones.  EXAM: ABDOMEN - 1 VIEW  COMPARISON:  Radiographs of Jun 08, 2016.  FINDINGS: The bowel gas pattern is normal. Bilateral nephrolithiasis is noted. Proximal right ureteral calculi noted on prior exam now all appear to be in the pelvis.  IMPRESSION: No evidence of bowel obstruction or ileus. Bilateral nephrolithiasis. Proximal right ureteral calculi noted on prior exam appear to have progressed to the distal right ureter.   Electronically Signed   By: Marijo Conception, M.D.   On: 07/11/2016 14:25  KUB was reviewed personally today.  Assessment & Plan:    1.  Right ureteral calculus Small fragment which has migrated distally towards the UVJ, likely cause of her current symptoms. No signs or symptoms of infection.  I recommended continued conservative management with aggressive IV fluids, resume Flomax and Ditropan when necessary for bladder spasms.  We'll obtain renal ultrasound next week to assess her ongoing hydronephrosis. She feels pass this fragment, she will need further intervention for the stone either in the form of repeat ESWL versus ureteroscopy.  Warning symptoms are reviewed today.  - US Renal; Future  2. Dysuria Likely referred pain secondary to #1 No evidence of infection on UA today  - Bladder Scan (Post Void Residual) in office - CULTURE, URINE COMPREHENSIVE - US Renal; Future - Urinalysis, Complete  3. Urinary frequency F/u as above - Urinalysis, Complete  Return for will call with RUS results.  Hollice Espy, MD  [facility] [address] [place], [place] [address] [phone]

## 2016-07-11 NOTE — Telephone Encounter (Signed)
Pt called office stating she has a feeling that something is lodged in bottom, unable to urinate it out.  Back pain, and has chills when she tries to urinate. Pt also states she feels like she is having to go to the restroom a lot, feels like she has to urinate constantly.  Pt wants to know if she can have an xray and what else can be done to help with this issue. Asks does she need to go to a walk in clinic etc.States something needs to be done she can't deal with this much longer. Please advise.

## 2016-07-11 NOTE — Telephone Encounter (Signed)
-----   Message from Hollice Espy, MD sent at 07/11/2016  3:18 PM EDT ----- Please let Mrs. Rosser know that the radiologist did read the report is a small right ureteral stone down by the bladder. This is likely the source of her pain. We'll follow-up with a renal ultrasound and if her kidney is still swollen, we will discuss further intervention is needed. Troponin fluids and take Flomax.  Hollice Espy, MD

## 2016-07-12 ENCOUNTER — Other Ambulatory Visit: Payer: Self-pay

## 2016-07-12 MED ORDER — OXYBUTYNIN CHLORIDE 5 MG PO TABS: 5.0000 mg | ORAL_TABLET | Freq: Three times a day (TID) | ORAL | 0 refills | Status: DC | PRN

## 2016-07-12 NOTE — Telephone Encounter (Signed)
Called in patients oxybutynin 5mg  spoke to [place] at [facility]. Called patient and let her know. ok'd with Cassandra to do so  [place]

## 2016-07-12 NOTE — Telephone Encounter (Signed)
Spoke to patient before Michelle's note. Gave imaging results and instructions. Order placed for renal ultrasound yesterday.  Advised will be contacted for scheduling. Patient verbalized understanding.

## 2016-07-15 LAB — CULTURE, URINE COMPREHENSIVE

## 2016-07-17 ENCOUNTER — Telehealth: Payer: Self-pay

## 2016-07-17 MED ORDER — NITROFURANTOIN MONOHYD MACRO 100 MG PO CAPS: 100.0000 mg | ORAL_CAPSULE | Freq: Two times a day (BID) | ORAL | 0 refills | Status: DC

## 2016-07-17 NOTE — Telephone Encounter (Signed)
Called patient. Per man that answered pt not in, will have her to return call when she returns. Sent Rx.

## 2016-07-17 NOTE — Telephone Encounter (Signed)
-----   Message from Hollice Espy, MD sent at 07/17/2016  8:30 AM EDT ----- Please let this patient know that she grew a low colony count of bacteria which we need to treat.  Please prescribe macrobid 100 mg bid x 10 days.    Hollice Espy, MD

## 2016-07-17 NOTE — Telephone Encounter (Signed)
Patient returned call. Gave instructions per Dr. Cherrie Gauze previous notes. Patient verbalized understanding.

## 2016-07-19 ENCOUNTER — Ambulatory Visit
Admission: RE | Admit: 2016-07-19 | Discharge: 2016-07-19 | Disposition: A | Discharge status: Home / Self Care | Payer: Medicare Other | Source: Ambulatory Visit | Attending: Urology | Admitting: Urology

## 2016-07-19 DIAGNOSIS — R300 Dysuria: Secondary | ICD-10-CM | POA: Diagnosis not present

## 2016-07-19 DIAGNOSIS — N281 Cyst of kidney, acquired: Secondary | ICD-10-CM | POA: Diagnosis not present

## 2016-07-19 DIAGNOSIS — N201 Calculus of ureter: Secondary | ICD-10-CM

## 2016-07-21 ENCOUNTER — Ambulatory Visit: Payer: Medicare Other | Admitting: Urology

## 2016-09-06 ENCOUNTER — Encounter: Payer: Self-pay | Admitting: Urology

## 2016-09-06 ENCOUNTER — Ambulatory Visit
Admission: RE | Admit: 2016-09-06 | Discharge: 2016-09-06 | Disposition: A | Discharge status: Home / Self Care | Payer: Medicare Other | Source: Ambulatory Visit | Attending: Urology | Admitting: Urology

## 2016-09-06 ENCOUNTER — Ambulatory Visit (INDEPENDENT_AMBULATORY_CARE_PROVIDER_SITE_OTHER): Payer: Medicare Other | Admitting: Urology

## 2016-09-06 VITALS — BP 127/64 | HR 65 | Ht 65.0 in | Wt 142.0 lb

## 2016-09-06 DIAGNOSIS — Z87442 Personal history of urinary calculi: Secondary | ICD-10-CM

## 2016-09-06 DIAGNOSIS — N200 Calculus of kidney: Secondary | ICD-10-CM | POA: Insufficient documentation

## 2016-09-06 DIAGNOSIS — M545 Low back pain, unspecified: Secondary | ICD-10-CM

## 2016-09-06 DIAGNOSIS — I700 Atherosclerosis of aorta: Secondary | ICD-10-CM | POA: Diagnosis not present

## 2016-09-06 LAB — URINALYSIS, COMPLETE
Bilirubin, UA: NEGATIVE
Glucose, UA: NEGATIVE
Nitrite, UA: NEGATIVE
Specific Gravity, UA: >1.03 — ABNORMAL HIGH (ref 1.005–1.030)
Urobilinogen, Ur: 0.2 mg/dL (ref 0.2–1.0)
pH, UA: 5.5 (ref 5.0–7.5)

## 2016-09-06 LAB — MICROSCOPIC EXAMINATION: RBC, UA: NONE SEEN /hpf (ref 0–?)

## 2016-09-06 NOTE — Progress Notes (Signed)
09/06/2016 2:20 PM   Tiffany Cole [birth year] [id number]  Referring provider: Dion Body, MD [place] [facility] [place], [place] [address]  Chief Complaint  Patient presents with  . Urinary Frequency    flnak pain    HPI: The 83-year-old female with a history of recurrent nephrolithiasis who returns today with pain along with pain across her lower back.  She is concerned she is having another stone episode.  Pain is dull and aching and diffuse across her lower back.  It is exacerbated by activity.  It is worse at the end of the day.  She denies any urinary symptoms including no dysuria or gross hematuria.  She did undergo right ESWL 05/4313 complicated by retained fragment which she ultimately passed.   PMH: Past Medical History:  Diagnosis Date  . Aftercare following joint replacement 06/19/2013  . Chest pain with high risk for cardiac etiology 11/05/2014  . Heart palpitations 12/01/2014  . Pure hypercholesterolemia 07/07/2014  . S/P left unicompartmental knee replacement 04/14/2014  . Skin cancer     Surgical History: Past Surgical History:  Procedure Laterality Date  . ANKLE SURGERY    . APPENDECTOMY    . EXTRACORPOREAL SHOCK WAVE LITHOTRIPSY Right 05/25/2016   Procedure: EXTRACORPOREAL SHOCK WAVE LITHOTRIPSY (ESWL);  Surgeon: Hollice Espy, MD;  Location: [facility] ORS;  Service: Urology;  Laterality: Right;  . JOINT REPLACEMENT Left 2016   knee  . TONSILLECTOMY    . TUBAL LIGATION      Home Medications:  Allergies as of 09/06/2016      Reactions   Diazepam Other (See Comments)   Stops breathing (oversedation)      Medication List    as of 09/06/2016 11:59 PM   You have not been prescribed any medications.     Allergies:  Allergies  Allergen Reactions  . Diazepam Other (See Comments)    Stops breathing (oversedation)    Family History: Family History  Problem Relation Age of Onset  . Prostate cancer Brother   . Kidney cancer  Neg Hx   . Bladder Cancer Neg Hx     Social History:  reports that she has never smoked. She has never used smokeless tobacco. She reports that she does not drink alcohol or use drugs.  ROS: UROLOGY Frequent Urination?: Yes Hard to postpone urination?: No Burning/pain with urination?: No Get up at night to urinate?: No Leakage of urine?: No Urine stream starts and stops?: No Trouble starting stream?: No Do you have to strain to urinate?: No Blood in urine?: Yes Urinary tract infection?: No Sexually transmitted disease?: No Injury to kidneys or bladder?: No Painful intercourse?: No Weak stream?: No Currently pregnant?: No Vaginal bleeding?: No Last menstrual period?: n  Gastrointestinal Nausea?: No Vomiting?: No Indigestion/heartburn?: No Diarrhea?: No Constipation?: No  Constitutional Fever: No Night sweats?: No Weight loss?: No Fatigue?: No  Skin Skin rash/lesions?: No Itching?: No  Eyes Blurred vision?: No Double vision?: No  Ears/Nose/Throat Sore throat?: No Sinus problems?: Yes  Hematologic/Lymphatic Swollen glands?: No Easy bruising?: No  Cardiovascular Leg swelling?: No Chest pain?: No  Respiratory Cough?: No Shortness of breath?: No  Endocrine Excessive thirst?: No  Musculoskeletal Back pain?: No Joint pain?: No  Neurological Headaches?: No Dizziness?: No  Psychologic Depression?: No Anxiety?: No  Physical Exam: BP 127/64   Pulse 65   Ht 5\' 5"  (1.651 m)   Wt 142 lb (64.4 kg)   BMI 23.63 kg/m   Constitutional:  Alert  and oriented, No acute distress.  Appears younger than stated age. HEENT: [place] AT, moist mucus membranes.  Trachea midline, no masses. Cardiovascular: No clubbing, cyanosis, or edema. Respiratory: Normal respiratory effort, no increased work of breathing. GI: Abdomen is soft, nontender, nondistended, no abdominal masses GU: No CVA tenderness.  Skin: No rashes, bruises or suspicious lesions. Neurologic:  Grossly intact, no focal deficits, moving all 4 extremities. Psychiatric: Normal mood and affect.  Laboratory Data: Lab Results  Component Value Date   WBC 6.1 11/04/2014   HGB 13.6 11/04/2014   HCT 39.7 11/04/2014   MCV 96.6 11/04/2014   PLT 232 11/04/2014    Lab Results  Component Value Date   CREATININE 0.85 11/04/2014    Urinalysis Results for orders placed or performed in visit on 09/06/16  Microscopic Examination  Result Value Ref Range   WBC, UA 0-5 0 - 5 /hpf   RBC, UA None seen 0 - 2 /hpf   Epithelial Cells (non renal) 0-10 0 - 10 /hpf   Casts Present (A) None seen /lpf   Cast Type Hyaline casts N/A   Mucus, UA Present (A) Not Estab.   Bacteria, UA Few (A) None seen/Few  CULTURE, URINE COMPREHENSIVE  Result Value Ref Range   Urine Culture, Comprehensive Final report    Organism ID, Bacteria Comment   Urinalysis, Complete  Result Value Ref Range   Specific Gravity, UA >1.030 (H) 1.005 - 1.030   pH, UA 5.5 5.0 - 7.5   Color, UA Yellow Yellow   Appearance Ur Clear Clear   Leukocytes, UA Trace (A) Negative   Protein, UA 1+ (A) Negative/Trace   Glucose, UA Negative Negative   Ketones, UA Trace (A) Negative   RBC, UA 1+ (A) Negative   Bilirubin, UA Negative Negative   Urobilinogen, Ur 0.2 0.2 - 1.0 mg/dL   Nitrite, UA Negative Negative   Microscopic Examination See below:     Pertinent Imaging: STAT CT stone ordered  Assessment & Plan:    1. History of nephrolithiasis Given her history of nephrolithiasis and retained stone, recommend noncontrast CT stone protocol which was ordered today Call with results - Urinalysis, Complete - CT RENAL STONE STUDY - CULTURE, URINE COMPREHENSIVE  2. Acute bilateral low back pain without sciatica Given the nature of pain, suspect MSK as underlying cause of his pain does not lateralize Will evaluate further with #1 UA negative, no evidence of UTI  CT scan results\  Hollice Espy, MD  [facility] [address] [place],  [address] [phone]

## 2016-09-09 LAB — CULTURE, URINE COMPREHENSIVE

## 2017-03-28 ENCOUNTER — Emergency Department
Admission: EM | Admit: 2017-03-28 | Discharge: 2017-03-28 | Disposition: A | Discharge status: Home / Self Care | Payer: Medicare Other | Attending: Emergency Medicine | Admitting: Emergency Medicine

## 2017-03-28 ENCOUNTER — Encounter: Payer: Self-pay | Admitting: Intensive Care

## 2017-03-28 ENCOUNTER — Emergency Department: Payer: Medicare Other

## 2017-03-28 DIAGNOSIS — Z85828 Personal history of other malignant neoplasm of skin: Secondary | ICD-10-CM | POA: Diagnosis not present

## 2017-03-28 DIAGNOSIS — R0789 Other chest pain: Secondary | ICD-10-CM

## 2017-03-28 DIAGNOSIS — Z96652 Presence of left artificial knee joint: Secondary | ICD-10-CM | POA: Diagnosis not present

## 2017-03-28 HISTORY — DX: Calculus of kidney: N20.0

## 2017-03-28 LAB — TROPONIN I
Troponin I: <0.03 ng/mL (ref <0.03)
Troponin I: <0.03 ng/mL (ref <0.03)

## 2017-03-28 LAB — BASIC METABOLIC PANEL
Anion gap: 10 (ref 5–15)
BUN: 18 mg/dL (ref 6–20)
CO2: 27 mmol/L (ref 22–32)
Calcium: 9.1 mg/dL (ref 8.9–10.3)
Chloride: 105 mmol/L (ref 101–111)
Creatinine, Ser: 0.68 mg/dL (ref 0.44–1.00)
GFR calc Af Amer: >60 mL/min (ref >60)
GFR calc non Af Amer: >60 mL/min (ref >60)
Glucose, Bld: 114 mg/dL — ABNORMAL HIGH (ref 65–99)
Potassium: 4 mmol/L (ref 3.5–5.1)
Sodium: 142 mmol/L (ref 135–145)

## 2017-03-28 LAB — CBC
HCT: 42.3 % (ref 35.0–47.0)
Hemoglobin: 14.2 g/dL (ref 12.0–16.0)
MCH: 32.7 pg (ref 26.0–34.0)
MCHC: 33.5 g/dL (ref 32.0–36.0)
MCV: 97.6 fL (ref 80.0–100.0)
Platelets: 234 10*3/uL (ref 150–440)
RBC: 4.34 MIL/uL (ref 3.80–5.20)
RDW: 13.7 % (ref 11.5–14.5)
WBC: 5 10*3/uL (ref 3.6–11.0)

## 2017-03-28 LAB — BRAIN NATRIURETIC PEPTIDE: B Natriuretic Peptide: 163 pg/mL — ABNORMAL HIGH (ref 0.0–100.0)

## 2017-03-28 MED ORDER — ACETAMINOPHEN 325 MG PO TABS
650.0000 mg | ORAL_TABLET | Freq: Once | ORAL | Status: DC
  Filled 2017-03-28: qty 2

## 2017-03-28 MED ORDER — BENZONATATE 100 MG PO CAPS
100.0000 mg | ORAL_CAPSULE | Freq: Once | ORAL | Status: DC
  Filled 2017-03-28: qty 1

## 2017-03-28 MED ORDER — ACETAMINOPHEN 325 MG PO TABS
650.0000 mg | ORAL_TABLET | Freq: Once | ORAL | Status: AC
  Administered 2017-03-28: 650 mg via ORAL

## 2017-03-28 NOTE — ED Triage Notes (Signed)
Patient c/o Left sided achy chest pain that woke her up throughout night and radiates to L shoulder blade and back. Ambulatory in triage. A&O x4

## 2017-03-28 NOTE — ED Provider Notes (Addendum)
[facility] Emergency Department Provider Note  ____________________________________________   I have reviewed the triage vital signs and the nursing notes. Where available I have reviewed prior notes and, if possible and indicated, outside hospital notes.    HISTORY  Chief Complaint Chest Pain    HPI Tiffany Cole is a 84 y.o. female who presents today complaining of left-sided chest wall pain.  She states that she was sleeping on her chest a cramp in her chest wall.  She states she woke up with it.  This persisted all day but now only when she moves or changes position or touches it.  No fever no chills no cough no shortness of breath no pleuritic chest pain no leg swelling no recent travel, pain is sharp, no other associated symptoms no prior treatment, she is eager to go home.  She states he feels much better.     Past Medical History:  Diagnosis Date  . Aftercare following joint replacement 06/19/2013  . Chest pain with high risk for cardiac etiology 11/05/2014  . Heart palpitations 12/01/2014  . Kidney stones   . Pure hypercholesterolemia 07/07/2014  . S/P left unicompartmental knee replacement 04/14/2014  . Skin cancer     Patient Active Problem List   Diagnosis Date Noted  . Heart palpitations 12/01/2014  . Chest pain with high risk for cardiac etiology 11/05/2014  . Pure hypercholesterolemia 07/07/2014  . S/P left unicompartmental knee replacement 04/14/2014  . Aftercare following joint replacement 06/19/2013    Past Surgical History:  Procedure Laterality Date  . ANKLE SURGERY    . APPENDECTOMY    . EXTRACORPOREAL SHOCK WAVE LITHOTRIPSY Right 05/25/2016   Procedure: EXTRACORPOREAL SHOCK WAVE LITHOTRIPSY (ESWL);  Surgeon: Hollice Espy, MD;  Location: [facility] ORS;  Service: Urology;  Laterality: Right;  . JOINT REPLACEMENT Left 2016   knee  . TONSILLECTOMY    . TUBAL LIGATION      Prior to Admission medications   Not on File     Allergies Diazepam  Family History  Problem Relation Age of Onset  . Prostate cancer Brother   . Kidney cancer Neg Hx   . Bladder Cancer Neg Hx     Social History Social History   Tobacco Use  . Smoking status: Never Smoker  . Smokeless tobacco: Never Used  Substance Use Topics  . Alcohol use: No  . Drug use: No    Review of Systems Constitutional: No fever/chills Eyes: No visual changes. ENT: No sore throat. No stiff neck no neck pain Cardiovascular: + chest pain. Respiratory: Denies shortness of breath. Gastrointestinal:   no vomiting.  No diarrhea.  No constipation. Genitourinary: Negative for dysuria. Musculoskeletal: Negative lower extremity swelling Skin: Negative for rash. Neurological: Negative for severe headaches, focal weakness or numbness.   ____________________________________________   PHYSICAL EXAM:  VITAL SIGNS: ED Triage Vitals  Enc Vitals Group     BP 03/28/17 1351 (!) 155/75     Pulse Rate 03/28/17 1351 66     Resp 03/28/17 1351 18     Temp 03/28/17 1351 98.1 F (36.7 C)     Temp Source 03/28/17 1351 Oral     SpO2 03/28/17 1351 100 %     Weight 03/28/17 1224 145 lb (65.8 kg)     Height 03/28/17 1224 5\' 5"  (1.651 m)     Head Circumference --      Peak Flow --      Pain Score 03/28/17 1224 7  Pain Loc --      Pain Edu? --      Excl. in [place]? --     Constitutional: Alert and oriented. Well appearing and in no acute distress. Eyes: Conjunctivae are normal Head: Atraumatic HEENT: No congestion/rhinnorhea. Mucous membranes are moist.  Oropharynx non-erythematous Neck:   Nontender with no meningismus, no masses, no stridor Cardiovascular: Normal rate, regular rhythm. Grossly normal heart sounds.  Good peripheral circulation. Respiratory: Normal respiratory effort.  No retractions. Lungs CTAB. Abdominal: Soft and nontender. No distention. No guarding no rebound Back: Some tenderness to palpation around the left shoulder blade which  reproduces her pain.  Also some tenderness to palpation in the trapezius muscle and along the chest wall on the left.  No crepitus no flail chest.  There is no midline tenderness there are no lesions noted. there is no CVA tenderness Musculoskeletal: No lower extremity tenderness, no upper extremity tenderness. No joint effusions, no DVT signs strong distal pulses no edema Neurologic:  Normal speech and language. No gross focal neurologic deficits are appreciated.  Skin:  Skin is warm, dry and intact. No rash noted. Psychiatric: Mood and affect are normal. Speech and behavior are normal.  ____________________________________________   LABS (all labs ordered are listed, but only abnormal results are displayed)  Labs Reviewed  BASIC METABOLIC PANEL - Abnormal; Notable for the following components:      Result Value   Glucose, Bld 114 (*)    All other components within normal limits  BRAIN NATRIURETIC PEPTIDE - Abnormal; Notable for the following components:   B Natriuretic Peptide 163.0 (*)    All other components within normal limits  CBC  TROPONIN I  TROPONIN I    Pertinent labs  results that were available during my care of the patient were reviewed by me and considered in my medical decision making (see chart for details). ____________________________________________  EKG  I personally interpreted any EKGs ordered by me or triage Sinus rhythm, rate 65 bpm no acute ST elevation or depression, nonspecific ST changes, borderline LAD ____________________________________________  RADIOLOGY  Pertinent labs & imaging results that were available during my care of the patient were reviewed by me and considered in my medical decision making (see chart for details). If possible, patient and/or family made aware of any abnormal findings.  Dg Chest 2 View  Result Date: 03/28/2017 CLINICAL DATA:  Chest pain radiating into the left shoulder which woke the patient from sleep. EXAM: CHEST  2  VIEW COMPARISON:  PA and lateral chest 11/04/2014. FINDINGS: The lungs are clear. Heart size is normal. No pneumothorax or pleural effusion. Aortic atherosclerosis is seen. Postoperative change right shoulder noted. IMPRESSION: No acute disease. Atherosclerosis. Electronically Signed   By: Inge Rise M.D.   On: 03/28/2017 13:10   ____________________________________________    PROCEDURES  Procedure(s) performed: None  Procedures  Critical Care performed: None  ____________________________________________   INITIAL IMPRESSION / ASSESSMENT AND PLAN / ED COURSE  Pertinent labs & imaging results that were available during my care of the patient were reviewed by me and considered in my medical decision making (see chart for details).  She is here with a constant ache in her left chest wall since she slept on it "funny" last night.  Very reproducible.  Low suspicion for ACS PE dissection Microdata Senokot as per colitis etc.  However, we will send a second cardiac marker as a precaution.  If negative I think she can safely go home we will also  give her Tylenol that she does not have any discomfort unless she touches it or change position.  Patient very much would prefer not to be admitted to the hospital, she states that she thinks it is a muscle ache and she wants to go home.  She understands that if she feels worse she is to come back and extensive return precautions and follow-up of been explained  ----------------------------------------- 5:20 PM on 03/28/2017 -----------------------------------------  Recent declines to wait for her second troponin, she states she does not like driving in the dark most of the right now.  This certainly is her choice however does limit my ability to take care of her as she knows, return cautions follow-up given and understood.    ____________________________________________   FINAL CLINICAL IMPRESSION(S) / ED DIAGNOSES  Final diagnoses:  None       This chart was dictated using voice recognition software.  Despite best efforts to proofread,  errors can occur which can change meaning.      Schuyler Amor, MD 03/28/17 1701    Schuyler Amor, MD 03/28/17 [phone]

## 2017-04-02 ENCOUNTER — Other Ambulatory Visit: Payer: Self-pay | Admitting: Family Medicine

## 2017-04-06 ENCOUNTER — Other Ambulatory Visit: Payer: Self-pay | Admitting: Family Medicine

## 2017-04-06 DIAGNOSIS — N649 Disorder of breast, unspecified: Secondary | ICD-10-CM

## 2017-04-10 ENCOUNTER — Ambulatory Visit
Admission: RE | Admit: 2017-04-10 | Discharge: 2017-04-10 | Disposition: A | Discharge status: Home / Self Care | Payer: Medicare Other | Source: Ambulatory Visit | Attending: Family Medicine | Admitting: Family Medicine

## 2017-04-10 DIAGNOSIS — N649 Disorder of breast, unspecified: Secondary | ICD-10-CM

## 2017-04-10 DIAGNOSIS — N6320 Unspecified lump in the left breast, unspecified quadrant: Secondary | ICD-10-CM | POA: Diagnosis present

## 2017-06-26 ENCOUNTER — Other Ambulatory Visit: Payer: Self-pay | Admitting: Unknown Physician Specialty

## 2017-06-26 DIAGNOSIS — Z96652 Presence of left artificial knee joint: Secondary | ICD-10-CM

## 2017-07-07 ENCOUNTER — Ambulatory Visit
Admission: RE | Admit: 2017-07-07 | Discharge: 2017-07-07 | Disposition: A | Discharge status: Home / Self Care | Payer: Medicare Other | Source: Ambulatory Visit | Attending: Unknown Physician Specialty | Admitting: Unknown Physician Specialty

## 2017-07-07 DIAGNOSIS — Z96652 Presence of left artificial knee joint: Secondary | ICD-10-CM

## 2017-07-07 DIAGNOSIS — S82145A Nondisplaced bicondylar fracture of left tibia, initial encounter for closed fracture: Secondary | ICD-10-CM | POA: Diagnosis not present

## 2017-07-07 DIAGNOSIS — G8929 Other chronic pain: Secondary | ICD-10-CM | POA: Insufficient documentation

## 2017-07-07 DIAGNOSIS — X58XXXA Exposure to other specified factors, initial encounter: Secondary | ICD-10-CM | POA: Diagnosis not present

## 2017-07-07 DIAGNOSIS — M25562 Pain in left knee: Secondary | ICD-10-CM | POA: Diagnosis present

## 2017-10-01 ENCOUNTER — Ambulatory Visit: Payer: Medicare Other

## 2017-10-01 ENCOUNTER — Telehealth: Payer: Self-pay | Admitting: Urology

## 2017-10-01 ENCOUNTER — Telehealth: Payer: Self-pay

## 2017-10-01 VITALS — BP 128/84 | HR 66 | Ht 65.0 in | Wt 143.8 lb

## 2017-10-01 DIAGNOSIS — R319 Hematuria, unspecified: Secondary | ICD-10-CM

## 2017-10-01 DIAGNOSIS — N200 Calculus of kidney: Secondary | ICD-10-CM

## 2017-10-01 LAB — MICROSCOPIC EXAMINATION
RBC, UA: >30 /hpf — ABNORMAL HIGH (ref 0–2)
WBC, UA: NONE SEEN /hpf (ref 0–5)

## 2017-10-01 LAB — URINALYSIS, COMPLETE
Bilirubin, UA: NEGATIVE
Glucose, UA: NEGATIVE
Ketones, UA: NEGATIVE
Nitrite, UA: NEGATIVE
Specific Gravity, UA: 1.025 (ref 1.005–1.030)
Urobilinogen, Ur: 0.2 mg/dL (ref 0.2–1.0)
pH, UA: 5 (ref 5.0–7.5)

## 2017-10-01 NOTE — Progress Notes (Signed)
Pt present in office complaining of hematuria, back and flank pain, lower abdominal pain. She left a urine sample for analysis and culture. Would like for pt to have KUB, due to hx of kidney stones.

## 2017-10-01 NOTE — Telephone Encounter (Signed)
Contacted pt for her to come in for KUB.

## 2017-10-02 ENCOUNTER — Ambulatory Visit
Admission: RE | Admit: 2017-10-02 | Discharge: 2017-10-02 | Disposition: A | Discharge status: Home / Self Care | Payer: Medicare Other | Source: Ambulatory Visit | Attending: Urology | Admitting: Urology

## 2017-10-02 DIAGNOSIS — N200 Calculus of kidney: Secondary | ICD-10-CM | POA: Diagnosis present

## 2017-10-02 DIAGNOSIS — N202 Calculus of kidney with calculus of ureter: Secondary | ICD-10-CM | POA: Diagnosis not present

## 2017-10-02 DIAGNOSIS — R319 Hematuria, unspecified: Secondary | ICD-10-CM

## 2017-10-03 LAB — CULTURE, URINE COMPREHENSIVE

## 2017-10-03 NOTE — Telephone Encounter (Signed)
-----   Message from Hollice Espy, MD sent at 10/03/2017  8:57 AM EDT ----- It looks like Ms. Preble is passing a large stone.  It will likely need surgical intervention again (ESWL or URS) based on the size.  I would like her to be seen ASAP by any provider to help facilitate getting her either on the truck or to surgery (add to wait list).  Please review precautions including fevers or poorly controlled pain.  Start Flomax.  She may also have some pain meds if needed.    Hollice Espy, MD

## 2017-10-03 NOTE — Telephone Encounter (Signed)
Pt informed, appt scheduled for 10/10/17.

## 2017-10-05 ENCOUNTER — Other Ambulatory Visit: Payer: Self-pay | Admitting: Radiology

## 2017-10-05 DIAGNOSIS — N200 Calculus of kidney: Secondary | ICD-10-CM

## 2017-10-05 DIAGNOSIS — N201 Calculus of ureter: Secondary | ICD-10-CM

## 2017-10-05 MED ORDER — TAMSULOSIN HCL 0.4 MG PO CAPS: 0.4000 mg | ORAL_CAPSULE | Freq: Every day | ORAL | 0 refills | Status: DC

## 2017-10-05 NOTE — Telephone Encounter (Signed)
LMOM to notify patient of script for Flomax sent to pharmacy & inform patient to go to emergency room if she develops fevers or poorly controlled pain.

## 2017-10-10 ENCOUNTER — Other Ambulatory Visit: Payer: Self-pay | Admitting: Radiology

## 2017-10-10 ENCOUNTER — Ambulatory Visit (INDEPENDENT_AMBULATORY_CARE_PROVIDER_SITE_OTHER): Payer: Medicare Other | Admitting: Urology

## 2017-10-10 ENCOUNTER — Encounter: Payer: Self-pay | Admitting: Urology

## 2017-10-10 VITALS — BP 133/65 | HR 71 | Ht 65.0 in | Wt 144.0 lb

## 2017-10-10 DIAGNOSIS — N201 Calculus of ureter: Secondary | ICD-10-CM

## 2017-10-10 LAB — URINALYSIS, COMPLETE
Bilirubin, UA: NEGATIVE
Glucose, UA: NEGATIVE
Ketones, UA: NEGATIVE
Leukocytes, UA: NEGATIVE
Nitrite, UA: NEGATIVE
Protein, UA: NEGATIVE
Specific Gravity, UA: 1.02 (ref 1.005–1.030)
Urobilinogen, Ur: 0.2 mg/dL (ref 0.2–1.0)
pH, UA: 7 (ref 5.0–7.5)

## 2017-10-10 LAB — MICROSCOPIC EXAMINATION
RBC, UA: >30 /hpf — ABNORMAL HIGH (ref 0–2)
WBC, UA: NONE SEEN /hpf (ref 0–5)

## 2017-10-10 MED ORDER — CIPROFLOXACIN HCL 500 MG PO TABS
500.0000 mg | ORAL_TABLET | ORAL | Status: AC
  Administered 2017-10-11: 500 mg via ORAL

## 2017-10-10 NOTE — Progress Notes (Signed)
10/10/2017 9:33 AM   Tiffany Cole 16-Aug-1928 [id number]  Referring provider: Dion Body, MD [facility] [place], [place] [address]  Chief Complaint  Patient presents with  . Nephrolithiasis    follow up    HPI: 84 yo F with a history of kidney stones who presents today to discuss management of her 9 mm left proximal/ mid ureteral stone.  She notes that she has had intermittent gross hematuria since July.  She is also had a few occasions of left flank pain which come and go concerning for possible stone.  As in the past, she is using a massager to try to get the stone to pass unsuccessfully.  She denies any severe pain or nausea vomiting.  No fevers or chills.  No dysuria.  KUB performed on 10/02/2017 shows a 9 mm stone in the left proximal/mid ureter.  This is previously appreciated as a nonobstructing stone in the kidney on CT scan just over a year ago.  She does have a personal history of kidney stones.  She underwent shockwave lithotripsy last year for treatment of the stone which did fragment and ultimately passed although the transit time of the fragments was relatively prolonged.  Shockwave lithotripsy of the stone was otherwise well-tolerated.  UA today shows microscopic blood but no evidence of UTI.   PMH: Past Medical History:  Diagnosis Date  . Aftercare following joint replacement 06/19/2013  . Chest pain with high risk for cardiac etiology 11/05/2014  . Heart palpitations 12/01/2014  . Kidney stones   . Pure hypercholesterolemia 07/07/2014  . S/P left unicompartmental knee replacement 04/14/2014  . Skin cancer     Surgical History: Past Surgical History:  Procedure Laterality Date  . ANKLE SURGERY    . APPENDECTOMY    . EXTRACORPOREAL SHOCK WAVE LITHOTRIPSY Right 05/25/2016   Procedure: EXTRACORPOREAL SHOCK WAVE LITHOTRIPSY (ESWL);  Surgeon: Hollice Espy, MD;  Location: [facility] ORS;  Service: Urology;  Laterality: Right;  . JOINT  REPLACEMENT Left 2016   knee  . TONSILLECTOMY    . TUBAL LIGATION      Home Medications:  Allergies as of 10/10/2017      Reactions   Diazepam Other (See Comments)   Stops breathing (oversedation)      Medication List        Accurate as of 10/10/17  9:33 AM. Always use your most recent med list.          tamsulosin 0.4 MG Caps capsule Commonly known as:  FLOMAX Take 1 capsule (0.4 mg total) by mouth daily.       Allergies:  Allergies  Allergen Reactions  . Diazepam Other (See Comments)    Stops breathing (oversedation)    Family History: Family History  Problem Relation Age of Onset  . Prostate cancer Brother   . Kidney cancer Neg Hx   . Bladder Cancer Neg Hx   . Breast cancer Neg Hx     Social History:  reports that she has never smoked. She has never used smokeless tobacco. She reports that she does not drink alcohol or use drugs.  ROS: UROLOGY Frequent Urination?: No Hard to postpone urination?: No Burning/pain with urination?: No Get up at night to urinate?: Yes Leakage of urine?: No Urine stream starts and stops?: No Trouble starting stream?: No Do you have to strain to urinate?: No Blood in urine?: Yes Urinary tract infection?: No Sexually transmitted disease?: No Injury to kidneys or bladder?: No Painful  intercourse?: No Weak stream?: No Currently pregnant?: No Vaginal bleeding?: No Last menstrual period?: n  Gastrointestinal Nausea?: No Vomiting?: No Indigestion/heartburn?: No Diarrhea?: No Constipation?: No  Constitutional Fever: No Night sweats?: No Weight loss?: No Fatigue?: No  Skin Skin rash/lesions?: No Itching?: No  Eyes Blurred vision?: No Double vision?: No  Ears/Nose/Throat Sore throat?: No Sinus problems?: No  Hematologic/Lymphatic Swollen glands?: No Easy bruising?: No  Cardiovascular Leg swelling?: No Chest pain?: No  Respiratory Cough?: No Shortness of breath?: No  Endocrine Excessive thirst?:  No  Musculoskeletal Back pain?: No Joint pain?: No  Neurological Headaches?: No Dizziness?: No  Psychologic Depression?: No Anxiety?: No  Physical Exam: BP 133/65   Pulse 71   Ht 5\' 5"  (1.651 m)   Wt 144 lb (65.3 kg)   BMI 23.96 kg/m   Constitutional:  Alert and oriented, No acute distress.  Extremely well dressed.  Appears younger than stated age. HEENT: [place] AT, moist mucus membranes.  Trachea midline, no masses. Cardiovascular: No clubbing, cyanosis, or edema. Respiratory: Normal respiratory effort, no increased work of breathing. GI: Abdomen is soft, nontender, nondistended, no abdominal masses GU: Minimal left CVA tenderness. Skin: No rashes, bruises or suspicious lesions. Neurologic: Grossly intact, no focal deficits, moving all 4 extremities. Psychiatric: Normal mood and affect.  Laboratory Data: Lab Results  Component Value Date   WBC 5.0 03/28/2017   HGB 14.2 03/28/2017   HCT 42.3 03/28/2017   MCV 97.6 03/28/2017   PLT 234 03/28/2017    Lab Results  Component Value Date   CREATININE 0.68 03/28/2017    Urinalysis    Component Value Date/Time   APPEARANCEUR Cloudy (A) 10/01/2017 1427   GLUCOSEU Negative 10/01/2017 1427   BILIRUBINUR Negative 10/01/2017 1427   PROTEINUR 2+ (A) 10/01/2017 1427   NITRITE Negative 10/01/2017 1427   LEUKOCYTESUR Trace (A) 10/01/2017 1427    Lab Results  Component Value Date   LABMICR See below: 10/01/2017   WBCUA None seen 10/01/2017   RBCUA >30 (H) 10/01/2017   LABEPIT 0-10 10/01/2017   MUCUS Present (A) 10/01/2017   BACTERIA Few (A) 10/01/2017    Pertinent Imaging: Results for orders placed during the hospital encounter of 10/02/17  Abdomen 1 view (KUB)   Narrative CLINICAL DATA:  Left-sided flank pain and hematuria for 2 weeks  EXAM: ABDOMEN - 1 VIEW  COMPARISON:  09/06/2016  FINDINGS: Degenerative changes of lumbar spine are noted. Scattered large and small bowel gas is seen. Scattered small right  renal calculi are again noted and stable. The previously seen left renal calculi are not as well appreciated. There is a calcification to the left of the midline at the L3-4 interspace measuring approximately 9 mm. This is consistent with migration into the left ureter. The need for CT urography can be determined on a clinical basis. Right hip replacement is noted.  IMPRESSION: Migration of left renal stone into the left mid ureter.  Stable right renal calculi.   Electronically Signed   By: Inez Catalina M.D.   On: 10/02/2017 21:57    KUB was personally reviewed.  This is compared to previous CT scan from 09/2016.  It appears that there is been interval migration of a nonobstructing stone into the proximal/ mid ureter.  Assessment & Plan:    1. Left ureteral stone 9 mm mid/proximal ureteral stone which remains intermittently symptomatic with evidence of microscopic blood consistent with retained stone.  Given the size and location of the stone, would recommend surgical intervention  especially when she has been having pain since July without interval passage.  No concern for infection.  We discussed various treatment options including ESWL vs. ureteroscopy, laser lithotripsy, and stent. We discussed the risks and benefits of both including bleeding, infection, damage to surrounding structures, efficacy with need for possible further intervention, and need for temporary ureteral stent.    She would like to try shockwave lithotripsy again given that that is minimally invasive.  Paperwork was filled out today and all questions were answered.  - Urinalysis, Complete  Hollice Espy, MD  [facility] [address], [place], [place] [address] [phone]

## 2017-10-10 NOTE — H&P (View-Only) (Signed)
10/10/2017 9:33 AM   Tiffany Cole March 31, 1928 [id number]  Referring provider: Dion Body, MD [facility] [place], [place] [address]  Chief Complaint  Patient presents with  . Nephrolithiasis    follow up    HPI: 84 yo F with a history of kidney stones who presents today to discuss management of her 9 mm left proximal/ mid ureteral stone.  She notes that she has had intermittent gross hematuria since July.  She is also had a few occasions of left flank pain which come and go concerning for possible stone.  As in the past, she is using a massager to try to get the stone to pass unsuccessfully.  She denies any severe pain or nausea vomiting.  No fevers or chills.  No dysuria.  KUB performed on 10/02/2017 shows a 9 mm stone in the left proximal/mid ureter.  This is previously appreciated as a nonobstructing stone in the kidney on CT scan just over a year ago.  She does have a personal history of kidney stones.  She underwent shockwave lithotripsy last year for treatment of the stone which did fragment and ultimately passed although the transit time of the fragments was relatively prolonged.  Shockwave lithotripsy of the stone was otherwise well-tolerated.  UA today shows microscopic blood but no evidence of UTI.   PMH: Past Medical History:  Diagnosis Date  . Aftercare following joint replacement 06/19/2013  . Chest pain with high risk for cardiac etiology 11/05/2014  . Heart palpitations 12/01/2014  . Kidney stones   . Pure hypercholesterolemia 07/07/2014  . S/P left unicompartmental knee replacement 04/14/2014  . Skin cancer     Surgical History: Past Surgical History:  Procedure Laterality Date  . ANKLE SURGERY    . APPENDECTOMY    . EXTRACORPOREAL SHOCK WAVE LITHOTRIPSY Right 05/25/2016   Procedure: EXTRACORPOREAL SHOCK WAVE LITHOTRIPSY (ESWL);  Surgeon: Hollice Espy, MD;  Location: [facility] ORS;  Service: Urology;  Laterality: Right;  . JOINT  REPLACEMENT Left 2016   knee  . TONSILLECTOMY    . TUBAL LIGATION      Home Medications:  Allergies as of 10/10/2017      Reactions   Diazepam Other (See Comments)   Stops breathing (oversedation)      Medication List        Accurate as of 10/10/17  9:33 AM. Always use your most recent med list.          tamsulosin 0.4 MG Caps capsule Commonly known as:  FLOMAX Take 1 capsule (0.4 mg total) by mouth daily.       Allergies:  Allergies  Allergen Reactions  . Diazepam Other (See Comments)    Stops breathing (oversedation)    Family History: Family History  Problem Relation Age of Onset  . Prostate cancer Brother   . Kidney cancer Neg Hx   . Bladder Cancer Neg Hx   . Breast cancer Neg Hx     Social History:  reports that she has never smoked. She has never used smokeless tobacco. She reports that she does not drink alcohol or use drugs.  ROS: UROLOGY Frequent Urination?: No Hard to postpone urination?: No Burning/pain with urination?: No Get up at night to urinate?: Yes Leakage of urine?: No Urine stream starts and stops?: No Trouble starting stream?: No Do you have to strain to urinate?: No Blood in urine?: Yes Urinary tract infection?: No Sexually transmitted disease?: No Injury to kidneys or bladder?: No Painful  intercourse?: No Weak stream?: No Currently pregnant?: No Vaginal bleeding?: No Last menstrual period?: n  Gastrointestinal Nausea?: No Vomiting?: No Indigestion/heartburn?: No Diarrhea?: No Constipation?: No  Constitutional Fever: No Night sweats?: No Weight loss?: No Fatigue?: No  Skin Skin rash/lesions?: No Itching?: No  Eyes Blurred vision?: No Double vision?: No  Ears/Nose/Throat Sore throat?: No Sinus problems?: No  Hematologic/Lymphatic Swollen glands?: No Easy bruising?: No  Cardiovascular Leg swelling?: No Chest pain?: No  Respiratory Cough?: No Shortness of breath?: No  Endocrine Excessive thirst?:  No  Musculoskeletal Back pain?: No Joint pain?: No  Neurological Headaches?: No Dizziness?: No  Psychologic Depression?: No Anxiety?: No  Physical Exam: BP 133/65   Pulse 71   Ht 5\' 5"  (1.651 m)   Wt 144 lb (65.3 kg)   BMI 23.96 kg/m   Constitutional:  Alert and oriented, No acute distress.  Extremely well dressed.  Appears younger than stated age. HEENT: [place] AT, moist mucus membranes.  Trachea midline, no masses. Cardiovascular: No clubbing, cyanosis, or edema. Respiratory: Normal respiratory effort, no increased work of breathing. GI: Abdomen is soft, nontender, nondistended, no abdominal masses GU: Minimal left CVA tenderness. Skin: No rashes, bruises or suspicious lesions. Neurologic: Grossly intact, no focal deficits, moving all 4 extremities. Psychiatric: Normal mood and affect.  Laboratory Data: Lab Results  Component Value Date   WBC 5.0 03/28/2017   HGB 14.2 03/28/2017   HCT 42.3 03/28/2017   MCV 97.6 03/28/2017   PLT 234 03/28/2017    Lab Results  Component Value Date   CREATININE 0.68 03/28/2017    Urinalysis    Component Value Date/Time   APPEARANCEUR Cloudy (A) 10/01/2017 1427   GLUCOSEU Negative 10/01/2017 1427   BILIRUBINUR Negative 10/01/2017 1427   PROTEINUR 2+ (A) 10/01/2017 1427   NITRITE Negative 10/01/2017 1427   LEUKOCYTESUR Trace (A) 10/01/2017 1427    Lab Results  Component Value Date   LABMICR See below: 10/01/2017   WBCUA None seen 10/01/2017   RBCUA >30 (H) 10/01/2017   LABEPIT 0-10 10/01/2017   MUCUS Present (A) 10/01/2017   BACTERIA Few (A) 10/01/2017    Pertinent Imaging: Results for orders placed during the hospital encounter of 10/02/17  Abdomen 1 view (KUB)   Narrative CLINICAL DATA:  Left-sided flank pain and hematuria for 2 weeks  EXAM: ABDOMEN - 1 VIEW  COMPARISON:  09/06/2016  FINDINGS: Degenerative changes of lumbar spine are noted. Scattered large and small bowel gas is seen. Scattered small right  renal calculi are again noted and stable. The previously seen left renal calculi are not as well appreciated. There is a calcification to the left of the midline at the L3-4 interspace measuring approximately 9 mm. This is consistent with migration into the left ureter. The need for CT urography can be determined on a clinical basis. Right hip replacement is noted.  IMPRESSION: Migration of left renal stone into the left mid ureter.  Stable right renal calculi.   Electronically Signed   By: Inez Catalina M.D.   On: 10/02/2017 21:57    KUB was personally reviewed.  This is compared to previous CT scan from 09/2016.  It appears that there is been interval migration of a nonobstructing stone into the proximal/ mid ureter.  Assessment & Plan:    1. Left ureteral stone 9 mm mid/proximal ureteral stone which remains intermittently symptomatic with evidence of microscopic blood consistent with retained stone.  Given the size and location of the stone, would recommend surgical intervention  especially when she has been having pain since July without interval passage.  No concern for infection.  We discussed various treatment options including ESWL vs. ureteroscopy, laser lithotripsy, and stent. We discussed the risks and benefits of both including bleeding, infection, damage to surrounding structures, efficacy with need for possible further intervention, and need for temporary ureteral stent.    She would like to try shockwave lithotripsy again given that that is minimally invasive.  Paperwork was filled out today and all questions were answered.  - Urinalysis, Complete  Hollice Espy, MD  [facility] [address] [place], [place] [address] [phone]

## 2017-10-11 ENCOUNTER — Encounter
Admission: RE | Disposition: A | Discharge status: Home / Self Care | Payer: Self-pay | Source: Ambulatory Visit | Attending: Urology

## 2017-10-11 ENCOUNTER — Ambulatory Visit: Payer: Medicare Other

## 2017-10-11 ENCOUNTER — Ambulatory Visit
Admission: RE | Admit: 2017-10-11 | Discharge: 2017-10-11 | Disposition: A | Discharge status: Home / Self Care | Payer: Medicare Other | Source: Ambulatory Visit | Attending: Urology | Admitting: Urology

## 2017-10-11 ENCOUNTER — Other Ambulatory Visit: Payer: Self-pay

## 2017-10-11 DIAGNOSIS — Z96652 Presence of left artificial knee joint: Secondary | ICD-10-CM | POA: Diagnosis not present

## 2017-10-11 DIAGNOSIS — Z85828 Personal history of other malignant neoplasm of skin: Secondary | ICD-10-CM | POA: Insufficient documentation

## 2017-10-11 DIAGNOSIS — N201 Calculus of ureter: Secondary | ICD-10-CM

## 2017-10-11 HISTORY — PX: EXTRACORPOREAL SHOCK WAVE LITHOTRIPSY: SHX1557

## 2017-10-11 SURGERY — LITHOTRIPSY, ESWL
Anesthesia: Moderate Sedation | Laterality: Left

## 2017-10-11 MED ORDER — DIPHENHYDRAMINE HCL 25 MG PO CAPS
ORAL_CAPSULE | ORAL | Status: AC
  Administered 2017-10-11: 25 mg via ORAL
  Filled 2017-10-11: qty 1

## 2017-10-11 MED ORDER — ONDANSETRON HCL 4 MG/2ML IJ SOLN
INTRAMUSCULAR | Status: AC
  Administered 2017-10-11: 4 mg via INTRAVENOUS
  Filled 2017-10-11: qty 2

## 2017-10-11 MED ORDER — CIPROFLOXACIN HCL 500 MG PO TABS
ORAL_TABLET | ORAL | Status: AC
  Administered 2017-10-11: 500 mg via ORAL
  Filled 2017-10-11: qty 1

## 2017-10-11 MED ORDER — ONDANSETRON HCL 4 MG/2ML IJ SOLN
4.0000 mg | Freq: Once | INTRAMUSCULAR | Status: AC | PRN
  Administered 2017-10-11: 4 mg via INTRAVENOUS

## 2017-10-11 MED ORDER — DIPHENHYDRAMINE HCL 25 MG PO CAPS
25.0000 mg | ORAL_CAPSULE | ORAL | Status: AC
  Administered 2017-10-11: 25 mg via ORAL

## 2017-10-11 MED ORDER — DOCUSATE SODIUM 100 MG PO CAPS: 100.0000 mg | ORAL_CAPSULE | Freq: Two times a day (BID) | ORAL | 0 refills | Status: DC

## 2017-10-11 MED ORDER — HYDROCODONE-ACETAMINOPHEN 5-325 MG PO TABS: 1.0000 | ORAL_TABLET | Freq: Four times a day (QID) | ORAL | 0 refills | Status: DC | PRN

## 2017-10-11 MED ORDER — SODIUM CHLORIDE 0.9 % IV SOLN
INTRAVENOUS | Status: DC
  Administered 2017-10-11: 08:00:00 via INTRAVENOUS

## 2017-10-11 NOTE — OR Nursing (Signed)
Discharge instructions discussed with pt and daughter. Both voice understanding. 

## 2017-10-11 NOTE — Discharge Instructions (Signed)
AMBULATORY SURGERY  DISCHARGE INSTRUCTIONS   1) The drugs that you were given will stay in your system until tomorrow so for the next 24 hours you should not:  A) Drive an automobile B) Make any legal decisions C) Drink any alcoholic beverage   2) You may resume regular meals tomorrow.  Today it is better to start with liquids and gradually work up to solid foods.  You may eat anything you prefer, but it is better to start with liquids, then soup and crackers, and gradually work up to solid foods.   3) Please notify your doctor immediately if you have any unusual bleeding, trouble breathing, redness and pain at the surgery site, drainage, fever, or pain not relieved by medication.    4) Additional Instructions:   Please contact your physician with any problems or Same Day Surgery at [phone], Monday through Friday 6 am to 4 pm, or [place] Main number at [phone].See [facility] discharge instructions in chart.  

## 2017-10-11 NOTE — Interval H&P Note (Signed)
History and Physical Interval Note:  10/11/2017 9:13 AM  Tiffany Cole  has presented today for surgery, with the diagnosis of Kidney stone  The various methods of treatment have been discussed with the patient and family. After consideration of risks, benefits and other options for treatment, the patient has consented to  Procedure(s): EXTRACORPOREAL SHOCK WAVE LITHOTRIPSY (ESWL) (Left) as a surgical intervention .  The patient's history has been reviewed, patient examined, no change in status, stable for surgery.  I have reviewed the patient's chart and labs.  Questions were answered to the patient's satisfaction.     Hollice Espy

## 2017-10-17 ENCOUNTER — Encounter: Payer: Self-pay | Admitting: Urology

## 2017-10-25 ENCOUNTER — Ambulatory Visit
Admission: RE | Admit: 2017-10-25 | Discharge: 2017-10-25 | Disposition: A | Discharge status: Home / Self Care | Payer: Medicare Other | Source: Ambulatory Visit | Attending: Urology | Admitting: Urology

## 2017-10-25 ENCOUNTER — Encounter: Payer: Self-pay | Admitting: Urology

## 2017-10-25 ENCOUNTER — Ambulatory Visit (INDEPENDENT_AMBULATORY_CARE_PROVIDER_SITE_OTHER): Payer: Medicare Other | Admitting: Urology

## 2017-10-25 VITALS — BP 136/69 | HR 60 | Ht 65.0 in | Wt 141.2 lb

## 2017-10-25 DIAGNOSIS — R310 Gross hematuria: Secondary | ICD-10-CM

## 2017-10-25 DIAGNOSIS — N201 Calculus of ureter: Secondary | ICD-10-CM

## 2017-10-25 DIAGNOSIS — N200 Calculus of kidney: Secondary | ICD-10-CM | POA: Diagnosis present

## 2017-10-25 DIAGNOSIS — N132 Hydronephrosis with renal and ureteral calculous obstruction: Secondary | ICD-10-CM

## 2017-10-25 DIAGNOSIS — N202 Calculus of kidney with calculus of ureter: Secondary | ICD-10-CM | POA: Diagnosis not present

## 2017-10-25 NOTE — Patient Instructions (Signed)
We will check you urine today (10/25/2017) for microscopic blood  The ultrasound department will be calling you to schedule the ultrasound of your kidneys to make sure that the left kidney is no longer swollen from the left ureteral stone.  I will call you with those results.  After the ultrasound is complete, I will contact [employer] which is the company that we will do the 56-ZFIL metabolic analysis for you.  They will be calling you to schedule a time to mail out the collection kit.  [facility] Instructions [facility]  You will receive a box/kit in the mail that will have a urine jug and instructions in the kit.  When the box arrives you will need to call our office [phone] to schedule a LAB appointment.  You will need to do a 24hour urine and this should be done during the days that our office will be open.  For example any day from Sunday through Thursday.  How to collect the urine sample: On the day you start the urine sample this 1st morning urine should NOT be collected.  For the rest of the day including all night urines should be collected.  On the next morning the 1st urine should be collected and then you will be finished with the urine collections.  You will need to bring the box with you on your LAB appointment day after urine has been collected and all instructions are complete in the box.  Your blood will be drawn and the box will be collected by our Lab employee to be sent off for analysis.  When urine and blood is complete you will need to schedule a follow up appointment for lab results.

## 2017-10-25 NOTE — Progress Notes (Signed)
10/25/2017 1:47 PM   Tiffany Cole 05/15/1928 [id number]  Referring provider: Dion Body, MD [facility] [facility] [place], [name] [address]  Chief Complaint  Patient presents with  . Nephrolithiasis    HPI: Patient is a 84 year old Caucasian female with a history of nephrolithiasis who is status post ESWL on 10/11/2017.  Background history 84 yo F with a history of kidney stones who presents today to discuss management of her 9 mm left proximal/ mid ureteral stone. She notes that she has had intermittent gross hematuria since July.  She is also had a few occasions of left flank pain which come and go concerning for possible stone.  As in the past, she is using a massager to try to get the stone to pass unsuccessfully.  She denies any severe pain or nausea vomiting.  No fevers or chills.  No dysuria.  KUB performed on 10/02/2017 shows a 9 mm stone in the left proximal/mid ureter.  This is previously appreciated as a nonobstructing stone in the kidney on CT scan just over a year ago.  She does have a personal history of kidney stones.  She underwent shockwave lithotripsy last year for treatment of the stone which did fragment and ultimately passed although the transit time of the fragments was relatively prolonged.  Shockwave lithotripsy of the stone was otherwise well-tolerated.  UA today shows microscopic blood but no evidence of UTI.  She underwent left ESWL on October 11, 2017.  Postprocedural course was as expected and uneventful.  KUB taken 10/25/2017    Today, she is not having any further flank pain.  She is not having gross hematuria.  Patient denies any gross hematuria, dysuria or suprapubic/flank pain.  Patient denies any fevers, chills, nausea or vomiting.   Her UA is positive for 3-10 RBC's.    PMH: Past Medical History:  Diagnosis Date  . Aftercare following joint replacement 06/19/2013  . Chest pain with high risk for cardiac etiology  11/05/2014  . Heart palpitations 12/01/2014  . Kidney stones   . Pure hypercholesterolemia 07/07/2014  . S/P left unicompartmental knee replacement 04/14/2014  . Skin cancer     Surgical History: Past Surgical History:  Procedure Laterality Date  . ANKLE SURGERY    . APPENDECTOMY    . EXTRACORPOREAL SHOCK WAVE LITHOTRIPSY Right 05/25/2016   Procedure: EXTRACORPOREAL SHOCK WAVE LITHOTRIPSY (ESWL);  Surgeon: Hollice Espy, MD;  Location: [facility] ORS;  Service: Urology;  Laterality: Right;  . EXTRACORPOREAL SHOCK WAVE LITHOTRIPSY Left 10/11/2017   Procedure: EXTRACORPOREAL SHOCK WAVE LITHOTRIPSY (ESWL);  Surgeon: Hollice Espy, MD;  Location: [facility] ORS;  Service: Urology;  Laterality: Left;  . JOINT REPLACEMENT Left 2016   knee  . TONSILLECTOMY    . TUBAL LIGATION      Home Medications:  Allergies as of 10/25/2017      Reactions   Diazepam Other (See Comments)   Stops breathing (oversedation)      Medication List    as of 10/25/2017  1:47 PM   You have not been prescribed any medications.     Allergies:  Allergies  Allergen Reactions  . Diazepam Other (See Comments)    Stops breathing (oversedation)    Family History: Family History  Problem Relation Age of Onset  . Prostate cancer Brother   . Kidney cancer Neg Hx   . Bladder Cancer Neg Hx   . Breast cancer Neg Hx     Social History:  reports that she has  never smoked. She has never used smokeless tobacco. She reports that she does not drink alcohol or use drugs.  ROS: UROLOGY Frequent Urination?: No Hard to postpone urination?: No Burning/pain with urination?: No Get up at night to urinate?: Yes Leakage of urine?: No Urine stream starts and stops?: No Trouble starting stream?: No Do you have to strain to urinate?: No Blood in urine?: No Urinary tract infection?: No Sexually transmitted disease?: No Injury to kidneys or bladder?: No Painful intercourse?: No Weak stream?: No Currently pregnant?: No Vaginal  bleeding?: No Last menstrual period?: n  Gastrointestinal Nausea?: No Vomiting?: No Indigestion/heartburn?: No Diarrhea?: No Constipation?: No  Constitutional Fever: No Night sweats?: No Weight loss?: No Fatigue?: No  Skin Skin rash/lesions?: No Itching?: No  Eyes Blurred vision?: No Double vision?: No  Ears/Nose/Throat Sore throat?: No Sinus problems?: Yes  Hematologic/Lymphatic Swollen glands?: No Easy bruising?: No  Cardiovascular Leg swelling?: No Chest pain?: No  Respiratory Cough?: No Shortness of breath?: No  Endocrine Excessive thirst?: No  Musculoskeletal Back pain?: No Joint pain?: Yes  Neurological Headaches?: No Dizziness?: No  Psychologic Depression?: No Anxiety?: No  Physical Exam: BP 136/69 (BP Location: Left Arm, Patient Position: Sitting, Cuff Size: Normal)   Pulse 60   Ht 5\' 5"  (1.651 m)   Wt 141 lb 3.2 oz (64 kg)   BMI 23.50 kg/m   Constitutional: Well nourished. Alert and oriented, No acute distress. HEENT: [place] AT, moist mucus membranes. Trachea midline, no masses. Cardiovascular: No clubbing, cyanosis, or edema. Respiratory: Normal respiratory effort, no increased work of breathing. Skin: No rashes, bruises or suspicious lesions. Lymph: No cervical or inguinal adenopathy. Neurologic: Grossly intact, no focal deficits, moving all 4 extremities. Psychiatric: Normal mood and affect.   Laboratory Data: Lab Results  Component Value Date   WBC 5.0 03/28/2017   HGB 14.2 03/28/2017   HCT 42.3 03/28/2017   MCV 97.6 03/28/2017   PLT 234 03/28/2017    Lab Results  Component Value Date   CREATININE 0.68 03/28/2017    Urinalysis 3-10 RBC's.  See Epic.    I have reviewed the labs.  Pertinent Imaging: CLINICAL DATA:  Lithotripsy on 10/11/2017 for left-sided stone, follow-up  EXAM: ABDOMEN - 1 VIEW  COMPARISON:  KUB of 10/11/2017  FINDINGS: There is minimal haziness at the site of the previously  fragmented calculus to the left of L4 which could represent pulverized calculi. No other calcifications are seen along the expected left ureteral course. Several right renal calculi remain. There are degenerative changes in the lumbar spine and right total hip replacement is present.  IMPRESSION: 1. The left ureteral calculus is not definitely seen and the could be small fragments at the previous site of that calculus although that may be artifactual as well. 2. At least 3 right renal calculi remain.   Electronically Signed   By: Ivar Drape M.D.   On: 10/25/2017 17:06 I have independently reviewed the films   Assessment & Plan:    1. Left ureteral stone Likely passed No fragments for analysis  2. Left hydronephrosis Obtain RUS to ensure the hydronephrosis has resolved   3. Gross hematuria  - UA today demonstrates 3-10 RBC's   - continue to monitor the patient's UA after the treatment/passage of the stone to ensure the hematuria has resolved  - if hematuria persists, we will pursue a hematuria workup with CT Urogram and cystoscopy if appropriate. - Urinalysis, Complete  4.  Nephrolithiasis Once renal ultrasound is completed and  hydronephrosis has been demonstrated as resolved she would like to pursue a 53-ZJQB metabolic work-up  Zara Council, PA-C  [facility] [address], [place], [place] [address] [phone]

## 2017-10-26 LAB — URINALYSIS, COMPLETE
Bilirubin, UA: NEGATIVE
Glucose, UA: NEGATIVE
Ketones, UA: NEGATIVE
Leukocytes, UA: NEGATIVE
Nitrite, UA: NEGATIVE
Protein, UA: NEGATIVE
Specific Gravity, UA: 1.02 (ref 1.005–1.030)
Urobilinogen, Ur: 0.2 mg/dL (ref 0.2–1.0)
pH, UA: 6 (ref 5.0–7.5)

## 2017-10-26 LAB — MICROSCOPIC EXAMINATION: WBC, UA: NONE SEEN /hpf (ref 0–5)

## 2017-11-07 ENCOUNTER — Ambulatory Visit
Admission: RE | Admit: 2017-11-07 | Discharge: 2017-11-07 | Disposition: A | Discharge status: Home / Self Care | Payer: Medicare Other | Source: Ambulatory Visit | Attending: Urology | Admitting: Urology

## 2017-11-07 DIAGNOSIS — N200 Calculus of kidney: Secondary | ICD-10-CM | POA: Insufficient documentation

## 2017-11-07 DIAGNOSIS — N281 Cyst of kidney, acquired: Secondary | ICD-10-CM | POA: Insufficient documentation

## 2017-11-08 ENCOUNTER — Telehealth: Payer: Self-pay | Admitting: Family Medicine

## 2017-11-08 NOTE — Telephone Encounter (Signed)
LMOM for patient to return call.

## 2017-11-08 NOTE — Telephone Encounter (Signed)
-----   Message from Nori Riis, PA-C sent at 11/08/2017  9:18 AM EDT ----- Please let Tiffany Cole know that her RUS was negative for swelling in her kidneys.  We can move forward with a 35-KKXF metabolic work-up.  I would also like to recheck a UA as there was red blood cells the urine she had at her office visit 2 weeks ago.  When she brings in her materials for the [place] to her labs she can certainly drop her UA off at that time.  Please order a 24 metabolic work-up for this patient.

## 2017-11-08 NOTE — Telephone Encounter (Signed)
Litholink faxed.

## 2017-11-08 NOTE — Telephone Encounter (Signed)
Patient called back and will wait for the kit to do the litholink and will do the UA at that time   [facility]

## 2017-11-12 DIAGNOSIS — Z66 Do not resuscitate: Secondary | ICD-10-CM | POA: Insufficient documentation

## 2017-11-15 DIAGNOSIS — E538 Deficiency of other specified B group vitamins: Secondary | ICD-10-CM | POA: Insufficient documentation

## 2017-11-15 DIAGNOSIS — E559 Vitamin D deficiency, unspecified: Secondary | ICD-10-CM | POA: Insufficient documentation

## 2017-11-16 ENCOUNTER — Other Ambulatory Visit: Payer: Medicare Other

## 2017-11-20 ENCOUNTER — Telehealth: Payer: Self-pay | Admitting: Urology

## 2017-11-20 NOTE — Telephone Encounter (Signed)
Would you check with Tiffany Cole and see if she has been able to complete her Litholink?

## 2017-11-23 NOTE — Telephone Encounter (Signed)
LMOM for patient to return call.

## 2017-11-26 NOTE — Telephone Encounter (Signed)
LMOM for patient to return call.

## 2017-11-27 NOTE — Telephone Encounter (Signed)
Litho was printed and scanned in chart.

## 2017-11-27 NOTE — Telephone Encounter (Signed)
Pt called and states she came in to office on 10/11 to turn in [place] and for lab draw.

## 2017-11-28 ENCOUNTER — Other Ambulatory Visit: Payer: Self-pay | Admitting: Urology

## 2017-11-29 ENCOUNTER — Other Ambulatory Visit: Payer: Self-pay | Admitting: Urology

## 2017-12-05 ENCOUNTER — Telehealth: Payer: Self-pay | Admitting: Urology

## 2017-12-05 NOTE — Telephone Encounter (Signed)
Please let Mrs. Tiffany Cole know that her metabolic work-up for kidney stones noted a low urine volume.  She is to increase her water intake to 2.5 L daily, which would be 10 to 12 cups of water daily.  I would advised to increase her water intake slowly as this will give her body more time to adjust to the increase in fluid.    She was also found to have hypo-citrate urea.  We could treat this condition with a medication called potassium citrate.  She should be advised that these pills are very large in size cannot be broken apart and can cause stomach upset.  We will need to do blood work quarterly while she is on this medication.  If she would like to try increasing her citrate level through her diet, I would increase her intake of citrus fluids.  I would also like to check her urine one more time to ensure no blood persists.

## 2017-12-05 NOTE — Telephone Encounter (Signed)
Patient notified and would like to try increasing citrate in diet, will come back and drop off UA in on month for recheck

## 2017-12-31 ENCOUNTER — Telehealth: Payer: Self-pay | Admitting: Urology

## 2017-12-31 ENCOUNTER — Other Ambulatory Visit: Payer: Self-pay

## 2017-12-31 ENCOUNTER — Other Ambulatory Visit: Payer: Medicare Other

## 2017-12-31 DIAGNOSIS — R310 Gross hematuria: Secondary | ICD-10-CM

## 2017-12-31 LAB — MICROSCOPIC EXAMINATION
Epithelial Cells (non renal): NONE SEEN /hpf (ref 0–10)
WBC, UA: NONE SEEN /hpf (ref 0–5)

## 2017-12-31 LAB — URINALYSIS, COMPLETE
Bilirubin, UA: NEGATIVE
Glucose, UA: NEGATIVE
Leukocytes, UA: NEGATIVE
Nitrite, UA: NEGATIVE
Specific Gravity, UA: 1.025 (ref 1.005–1.030)
Urobilinogen, Ur: 0.2 mg/dL (ref 0.2–1.0)
pH, UA: 5.5 (ref 5.0–7.5)

## 2017-12-31 NOTE — Telephone Encounter (Signed)
Please let Tiffany Cole know that her urine was negative for blood.  How is she doing with her dietary changes (increasing fluid)?  We should see her in one year for KUB and office visit.

## 2018-01-02 NOTE — Telephone Encounter (Signed)
Spoke with pt and informed her of Shannon's message. Per pt she is doing well and increasing her fluid. She is not having any symptoms and she states she will call back later to schedule an appt. Nothing further is needed

## 2018-01-06 IMAGING — CR DG ABDOMEN 1V
1 series · 2 of 2 positions shown · non-contrast
Comparison: 05/23/2016

CLINICAL DATA: Right ureteral stone

EXAM:
ABDOMEN - 1 VIEW

[Series 1: dg abd 1 view · 0.14mm/px · 2 of 2 slices shown]
[im 1/2]
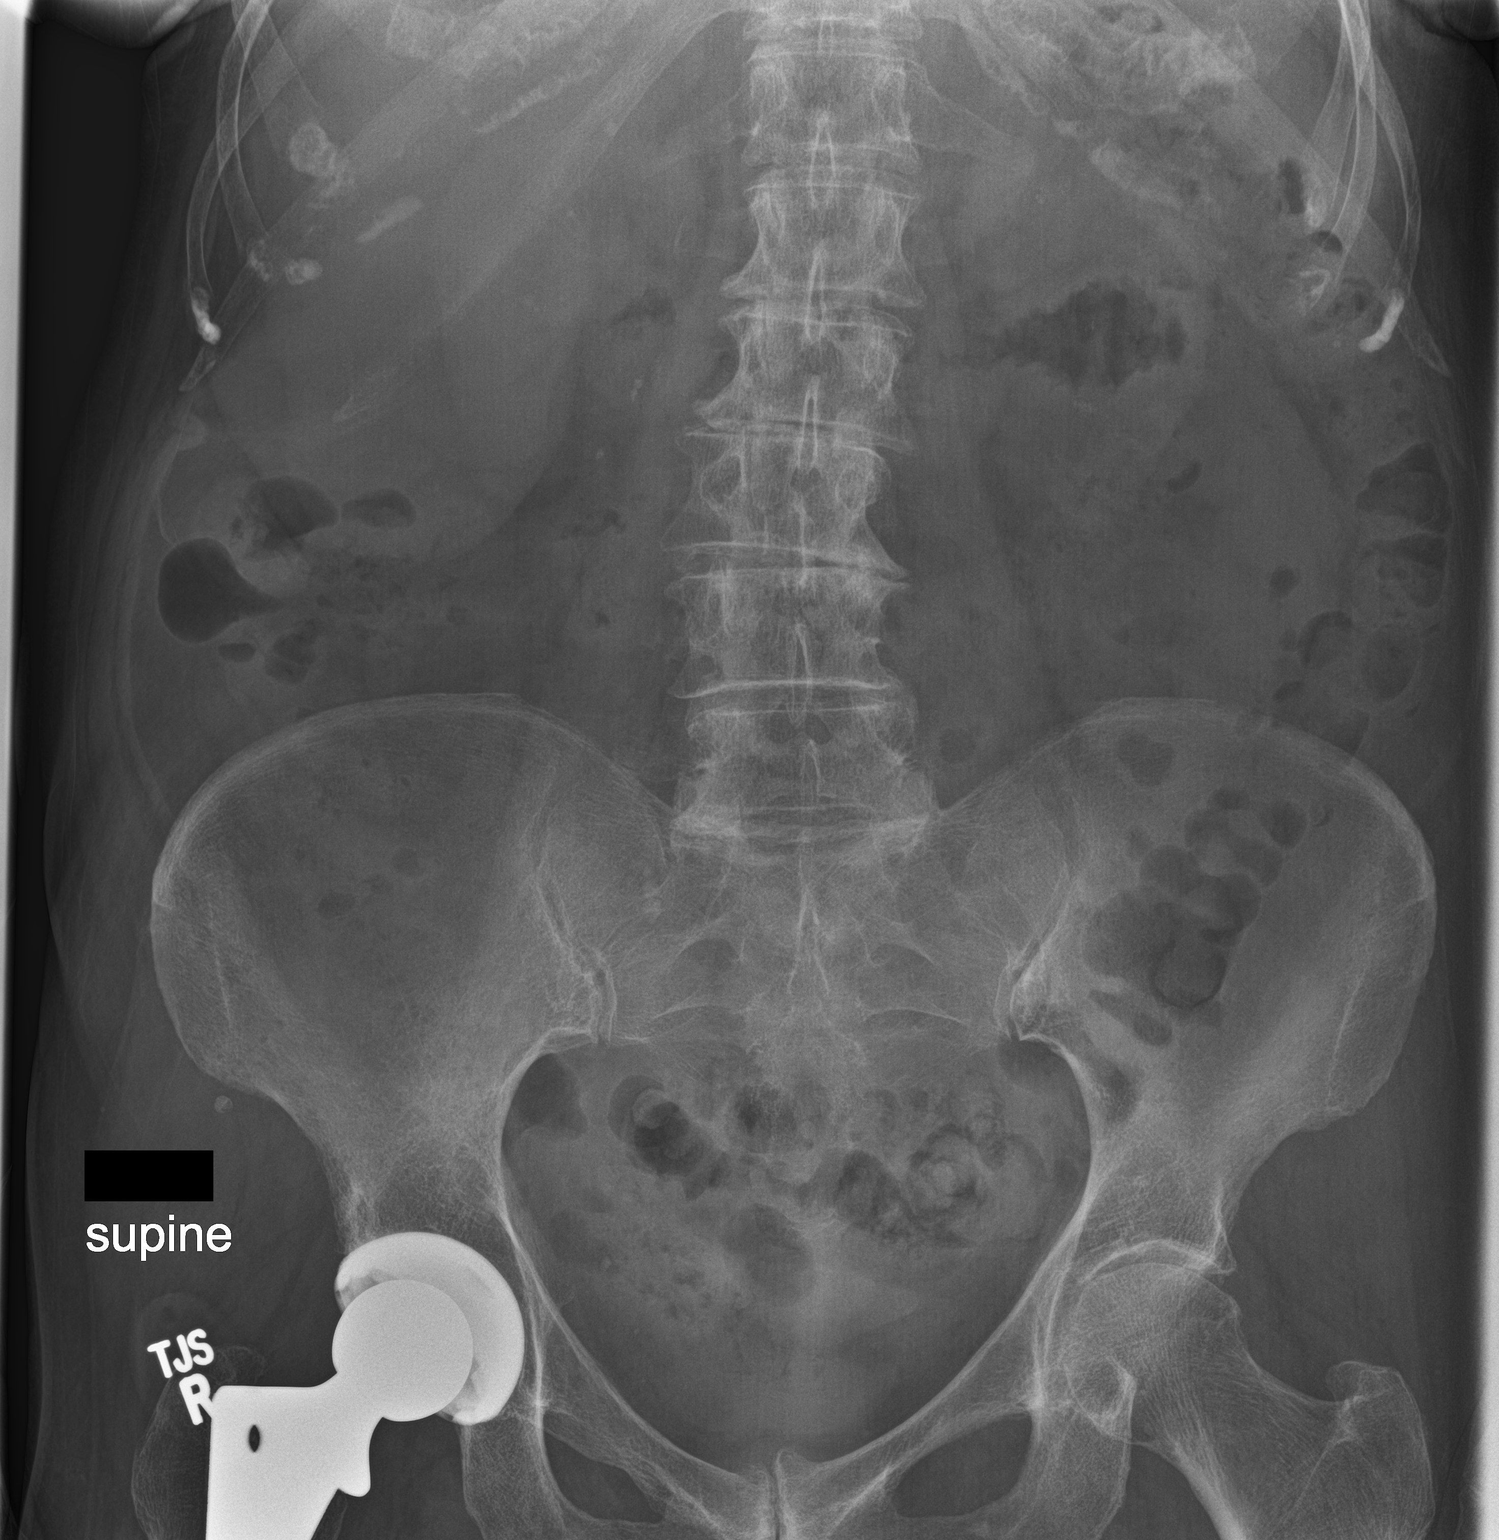
[im 2/2]
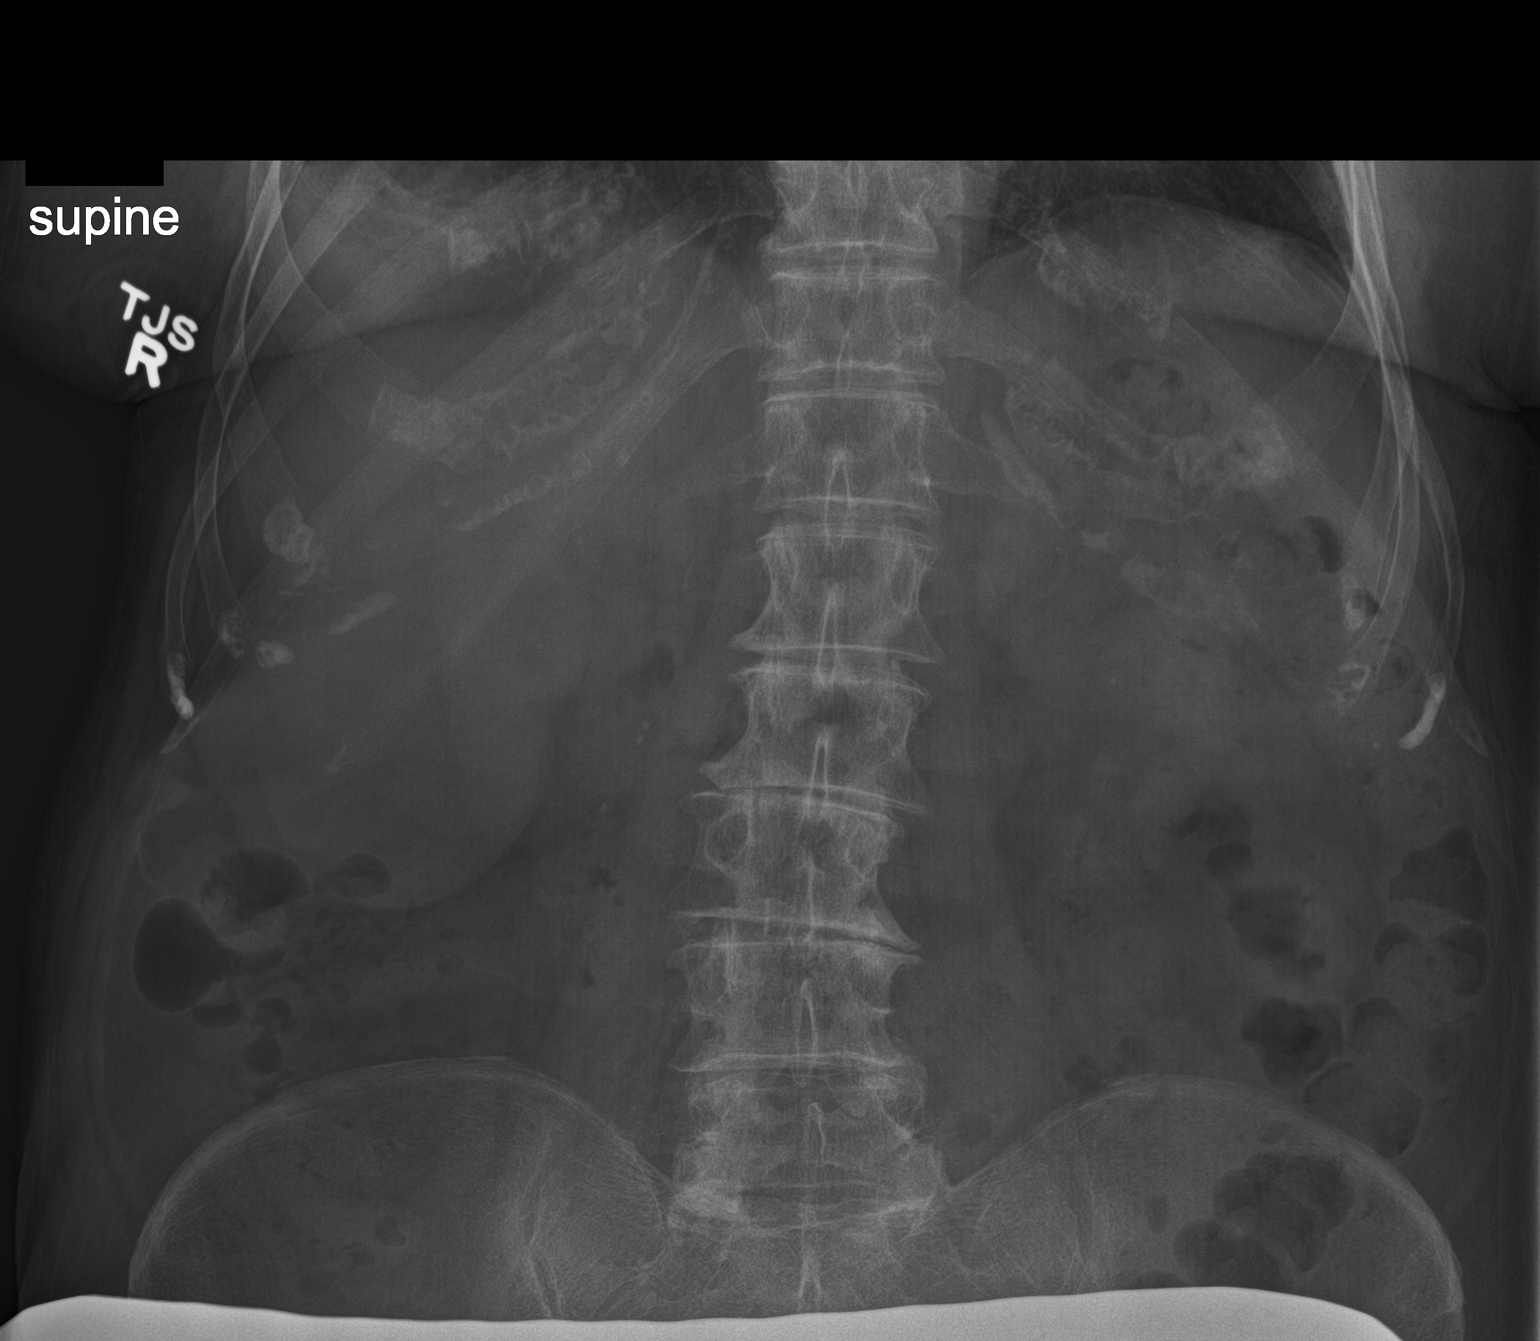

[2 of 2 positions shown; findings below may reference images not displayed]

FINDINGS: Three small calcifications in the right abdomen are noted, which may
rib represent small stone fragments. The previously seen larger
right proximal ureteral stone no longer visualized. Nonobstructive
bowel gas pattern.
IMPRESSION: Question small retained right proximal ureteral stone fragments.

## 2018-04-22 DIAGNOSIS — Z9889 Other specified postprocedural states: Secondary | ICD-10-CM | POA: Insufficient documentation

## 2018-07-24 ENCOUNTER — Other Ambulatory Visit: Payer: Self-pay | Admitting: Surgery

## 2018-07-24 DIAGNOSIS — Z9889 Other specified postprocedural states: Secondary | ICD-10-CM

## 2018-07-24 DIAGNOSIS — M7582 Other shoulder lesions, left shoulder: Secondary | ICD-10-CM

## 2018-07-31 ENCOUNTER — Ambulatory Visit
Admission: RE | Admit: 2018-07-31 | Discharge: 2018-07-31 | Disposition: A | Discharge status: Home / Self Care | Payer: Medicare Other | Source: Ambulatory Visit | Attending: Surgery | Admitting: Surgery

## 2018-07-31 ENCOUNTER — Other Ambulatory Visit: Payer: Self-pay

## 2018-07-31 DIAGNOSIS — Z9889 Other specified postprocedural states: Secondary | ICD-10-CM | POA: Insufficient documentation

## 2018-07-31 DIAGNOSIS — M7582 Other shoulder lesions, left shoulder: Secondary | ICD-10-CM | POA: Diagnosis not present

## 2018-08-02 DIAGNOSIS — M75122 Complete rotator cuff tear or rupture of left shoulder, not specified as traumatic: Secondary | ICD-10-CM | POA: Insufficient documentation

## 2018-08-02 DIAGNOSIS — M12811 Other specific arthropathies, not elsewhere classified, right shoulder: Secondary | ICD-10-CM | POA: Insufficient documentation

## 2018-08-02 DIAGNOSIS — M75121 Complete rotator cuff tear or rupture of right shoulder, not specified as traumatic: Secondary | ICD-10-CM | POA: Insufficient documentation

## 2018-08-02 DIAGNOSIS — M12812 Other specific arthropathies, not elsewhere classified, left shoulder: Secondary | ICD-10-CM | POA: Insufficient documentation

## 2019-04-15 DIAGNOSIS — I491 Atrial premature depolarization: Secondary | ICD-10-CM | POA: Insufficient documentation

## 2019-04-15 DIAGNOSIS — I493 Ventricular premature depolarization: Secondary | ICD-10-CM | POA: Insufficient documentation

## 2019-05-05 ENCOUNTER — Ambulatory Visit: Payer: Medicare Other | Admitting: Dermatology

## 2019-05-09 IMAGING — US US BREAST*L* LIMITED INC AXILLA
1 series · 5 of 5 positions shown · non-contrast
Comparison: Previous exam(s).

CLINICAL DATA: 88-year-old female with a left breast/possible skin
palpable abnormality.

EXAM:
DIGITAL DIAGNOSTIC BILATERAL MAMMOGRAM WITH CAD AND TOMO
LEFT BREAST ULTRASOUND

[Series 1: us breast*left* limited inc axilla · 0.06mm/px · 5 of 5 slices shown]
[im 1/5]
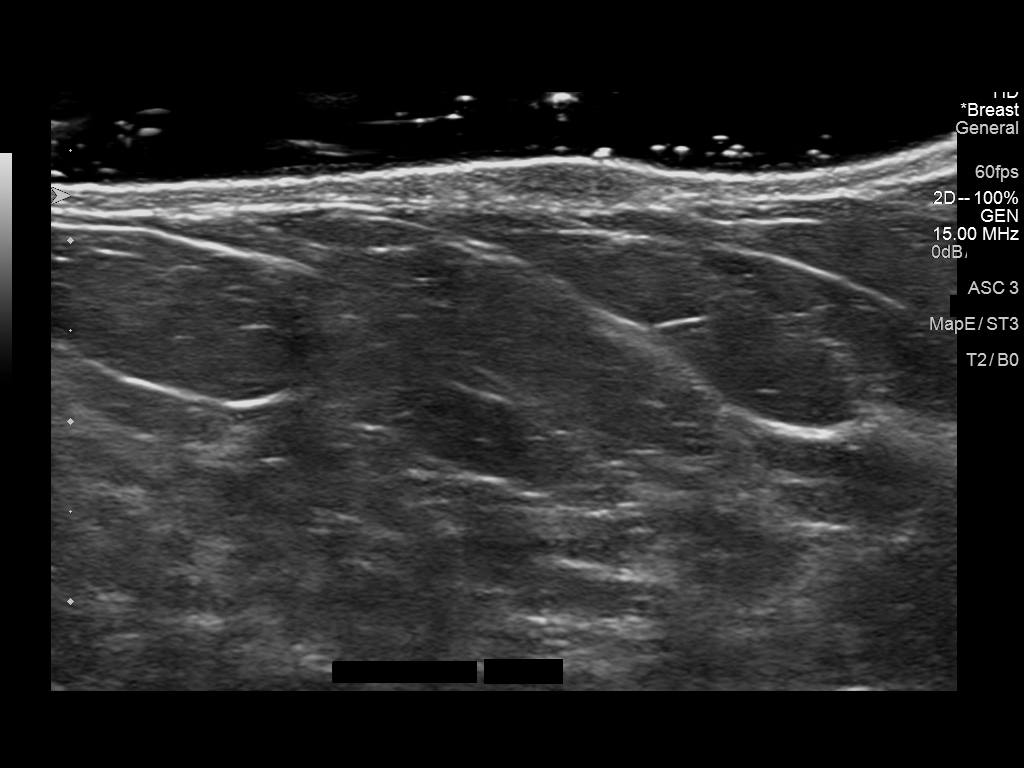
[im 2/5]
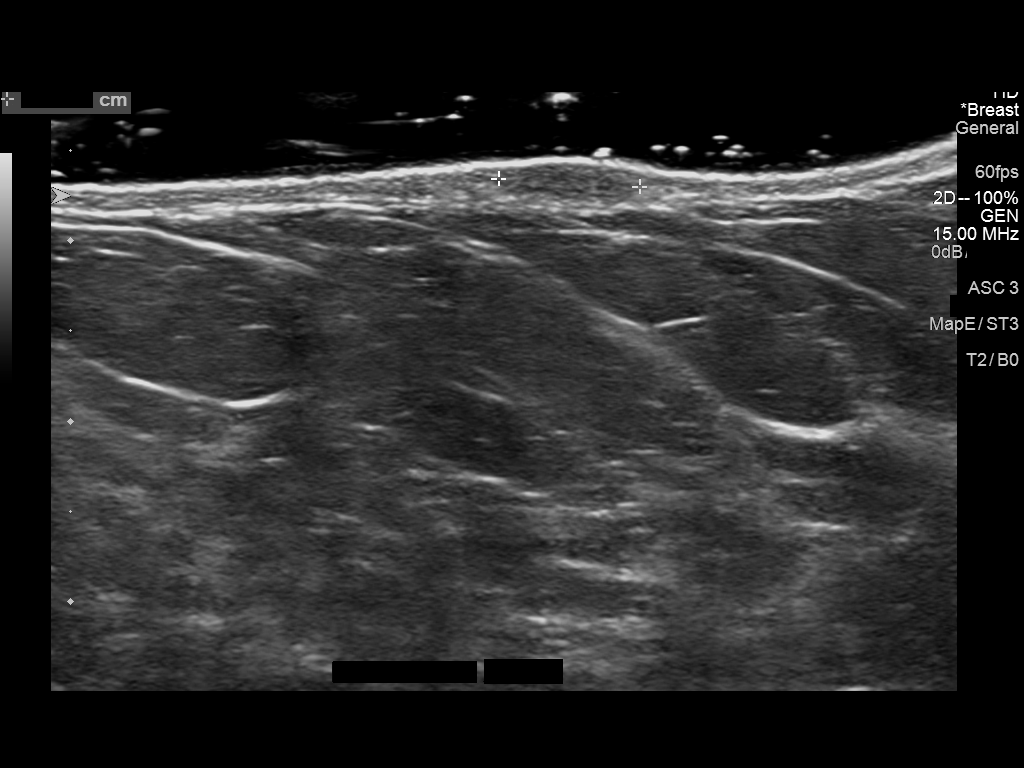
[im 3/5]
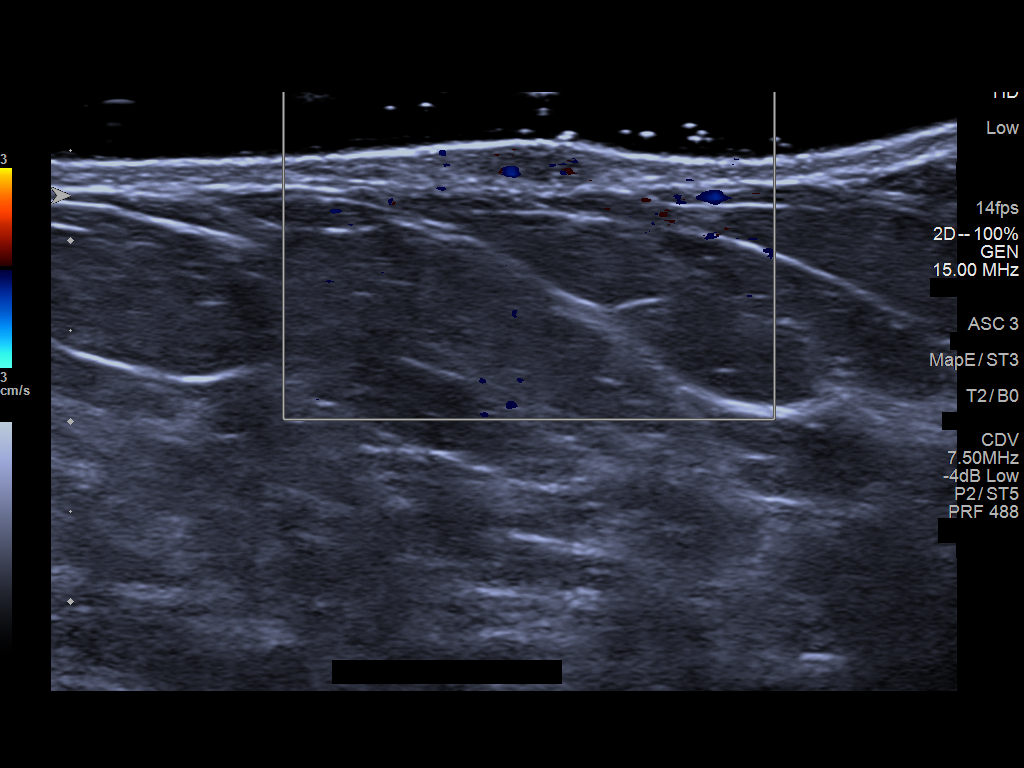
[im 4/5]
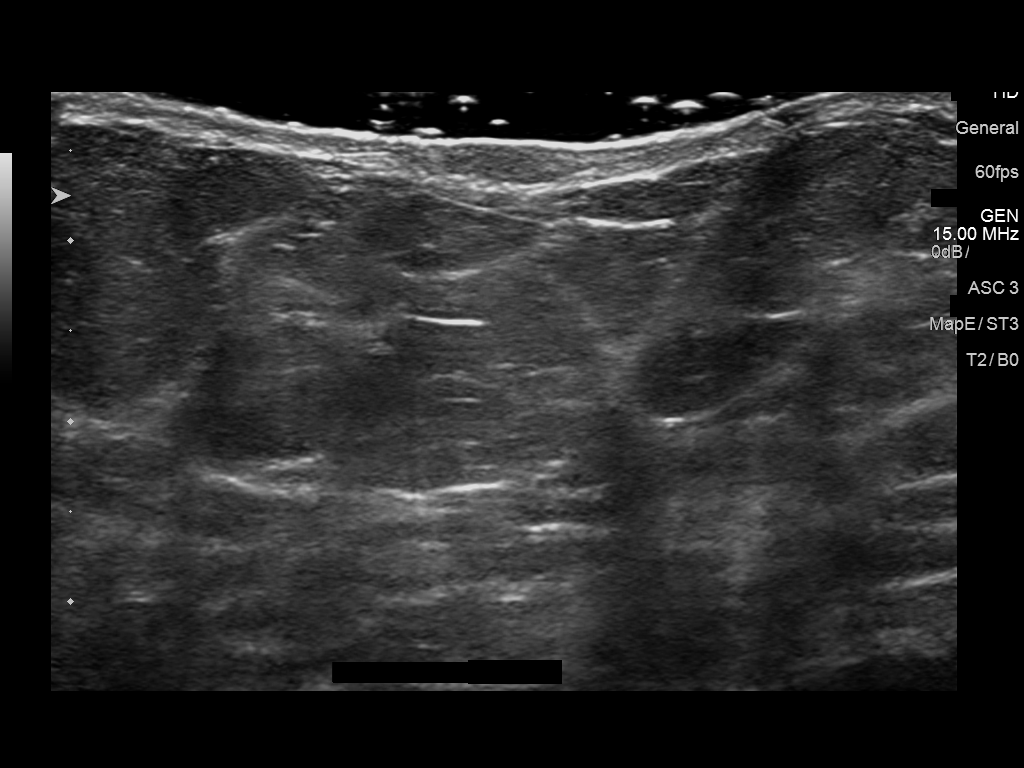
[im 5/5]
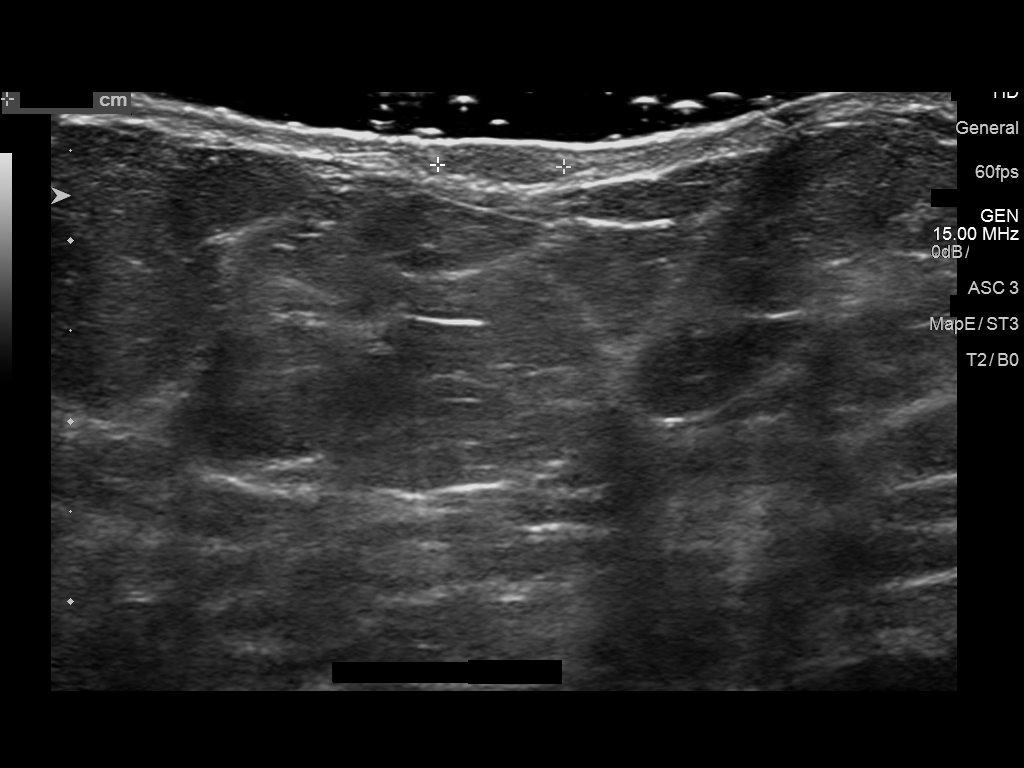

[5 of 5 positions shown; findings below may reference images not displayed]

ACR Breast Density Category b: There are scattered areas of
fibroglandular density.
FINDINGS: No suspicious masses or calcifications are seen in either breast.
Spot compression tangential tomograms were performed over the
palpable region of concern in the left breast with no definite
abnormalities identified.

Mammographic images were processed with CAD.

Physical examination at site of palpable concern in the outer left
breast reveals slightly pinkish discoloration with a small
thickening in the underlying skin. The patient states this is had
this appearance for long time and the area was previously "zapped"
by her physician.

Targeted ultrasound of the left breast was performed demonstrating
an oval hypoechoic/near isoechoic mass within the skin at 3 o'clock
4 cm from nipple measuring up to 0.7 cm. This could represent a
sebaceous cyst. No abnormalities identified in the underlying
breast.
IMPRESSION: 1.  No findings of malignancy in either breast.

2. Small oval mass/thickening in the skin, possibly a sebaceous cyst
at site of palpable concern.

RECOMMENDATION:
1. Clinical follow-up as needed for palpable abnormality involving
the skin of the left breast.

2.  Screening mammogram in one year.(Code:76-Z-A9C)

I have discussed the findings and recommendations with the patient.
Results were also provided in writing at the conclusion of the
visit. If applicable, a reminder letter will be sent to the patient
regarding the next appointment.

BI-RADS CATEGORY  2: Benign.

## 2019-05-25 IMAGING — CR DG ABDOMEN 1V
1 series · 2 of 2 positions shown · non-contrast
Comparison: KUB of 10/11/2017

CLINICAL DATA: Lithotripsy on 10/11/2017 for left-sided stone,
follow-up

EXAM:
ABDOMEN - 1 VIEW

[Series 1: dg abd 1 view · 0.14mm/px · 2 of 2 slices shown]
[im 1/2]
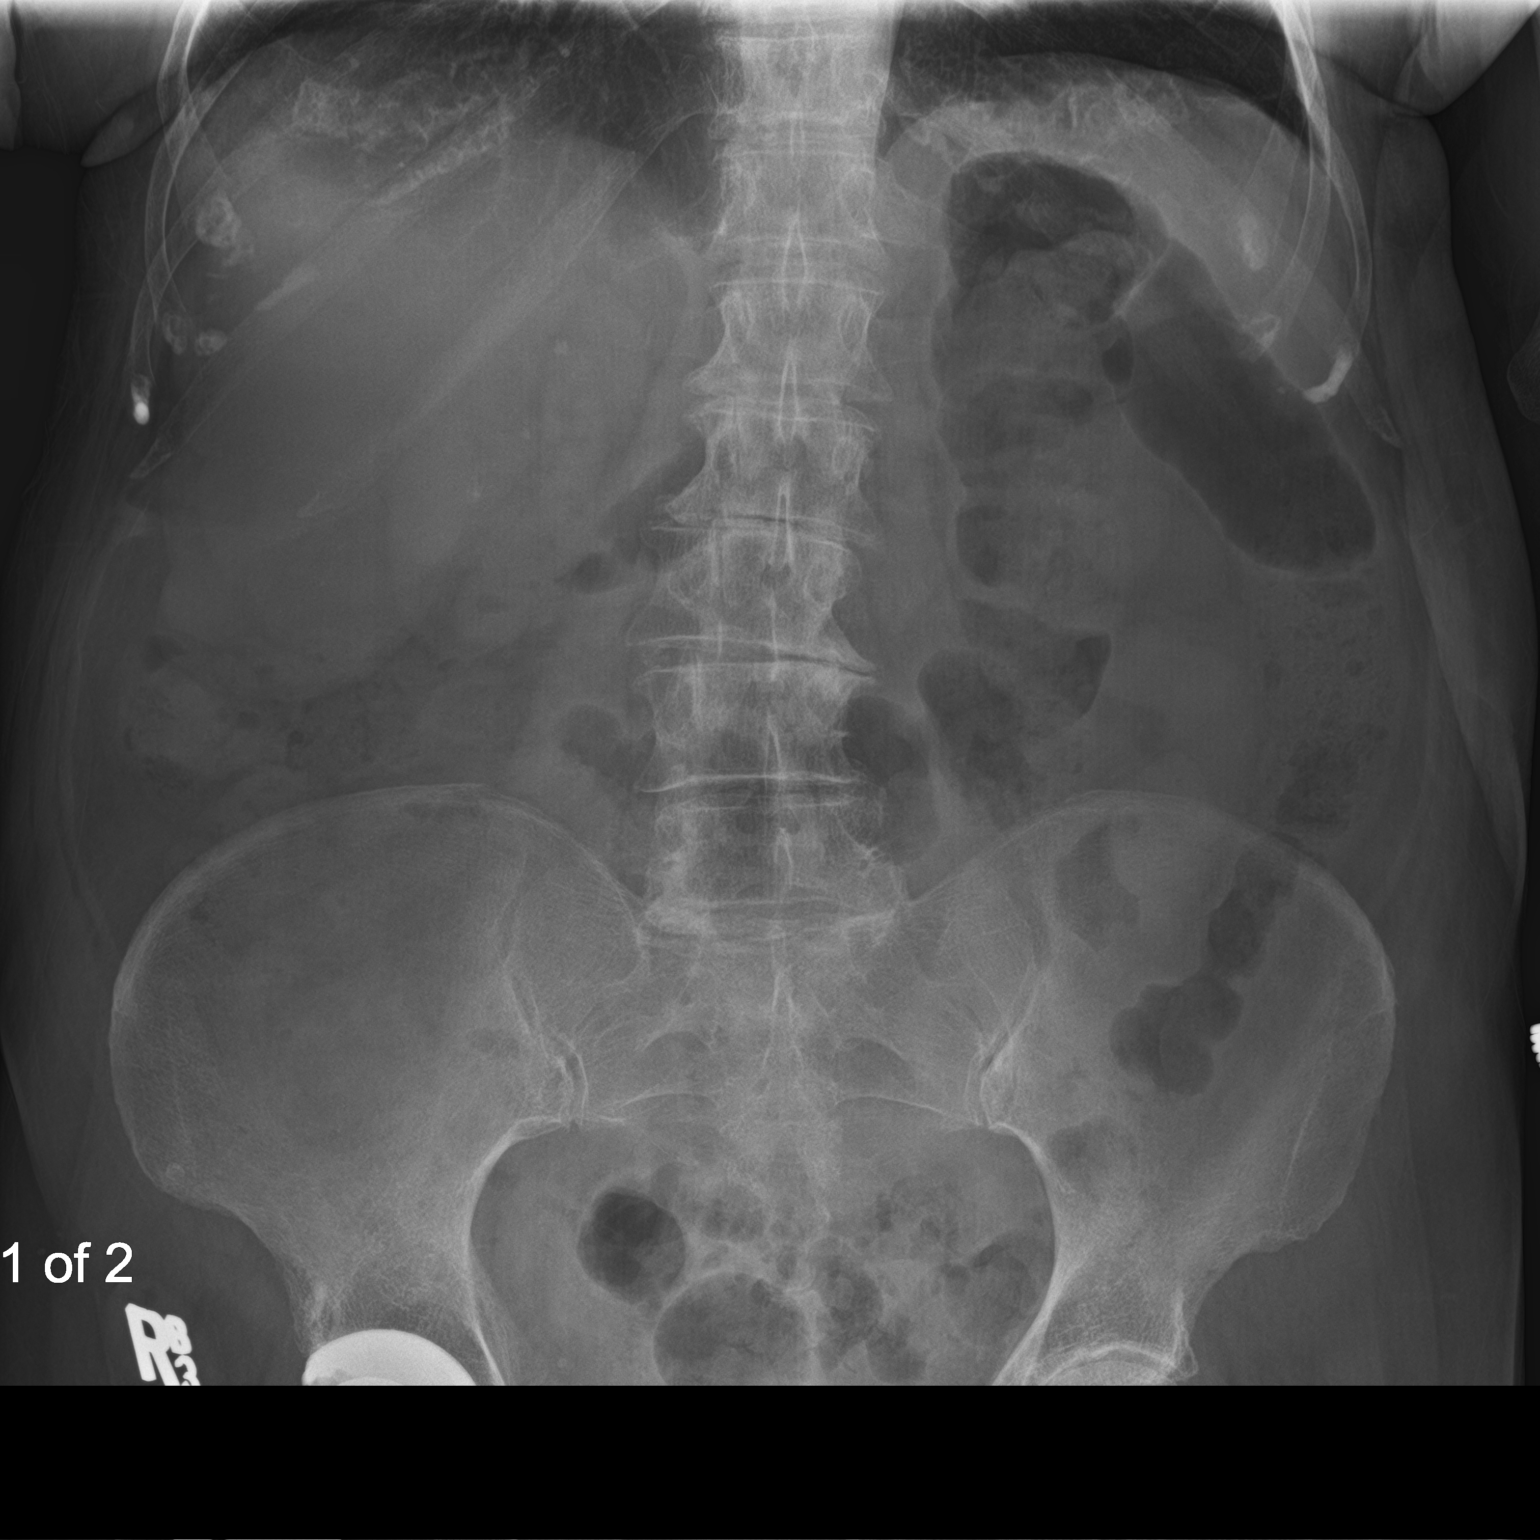
[im 2/2]
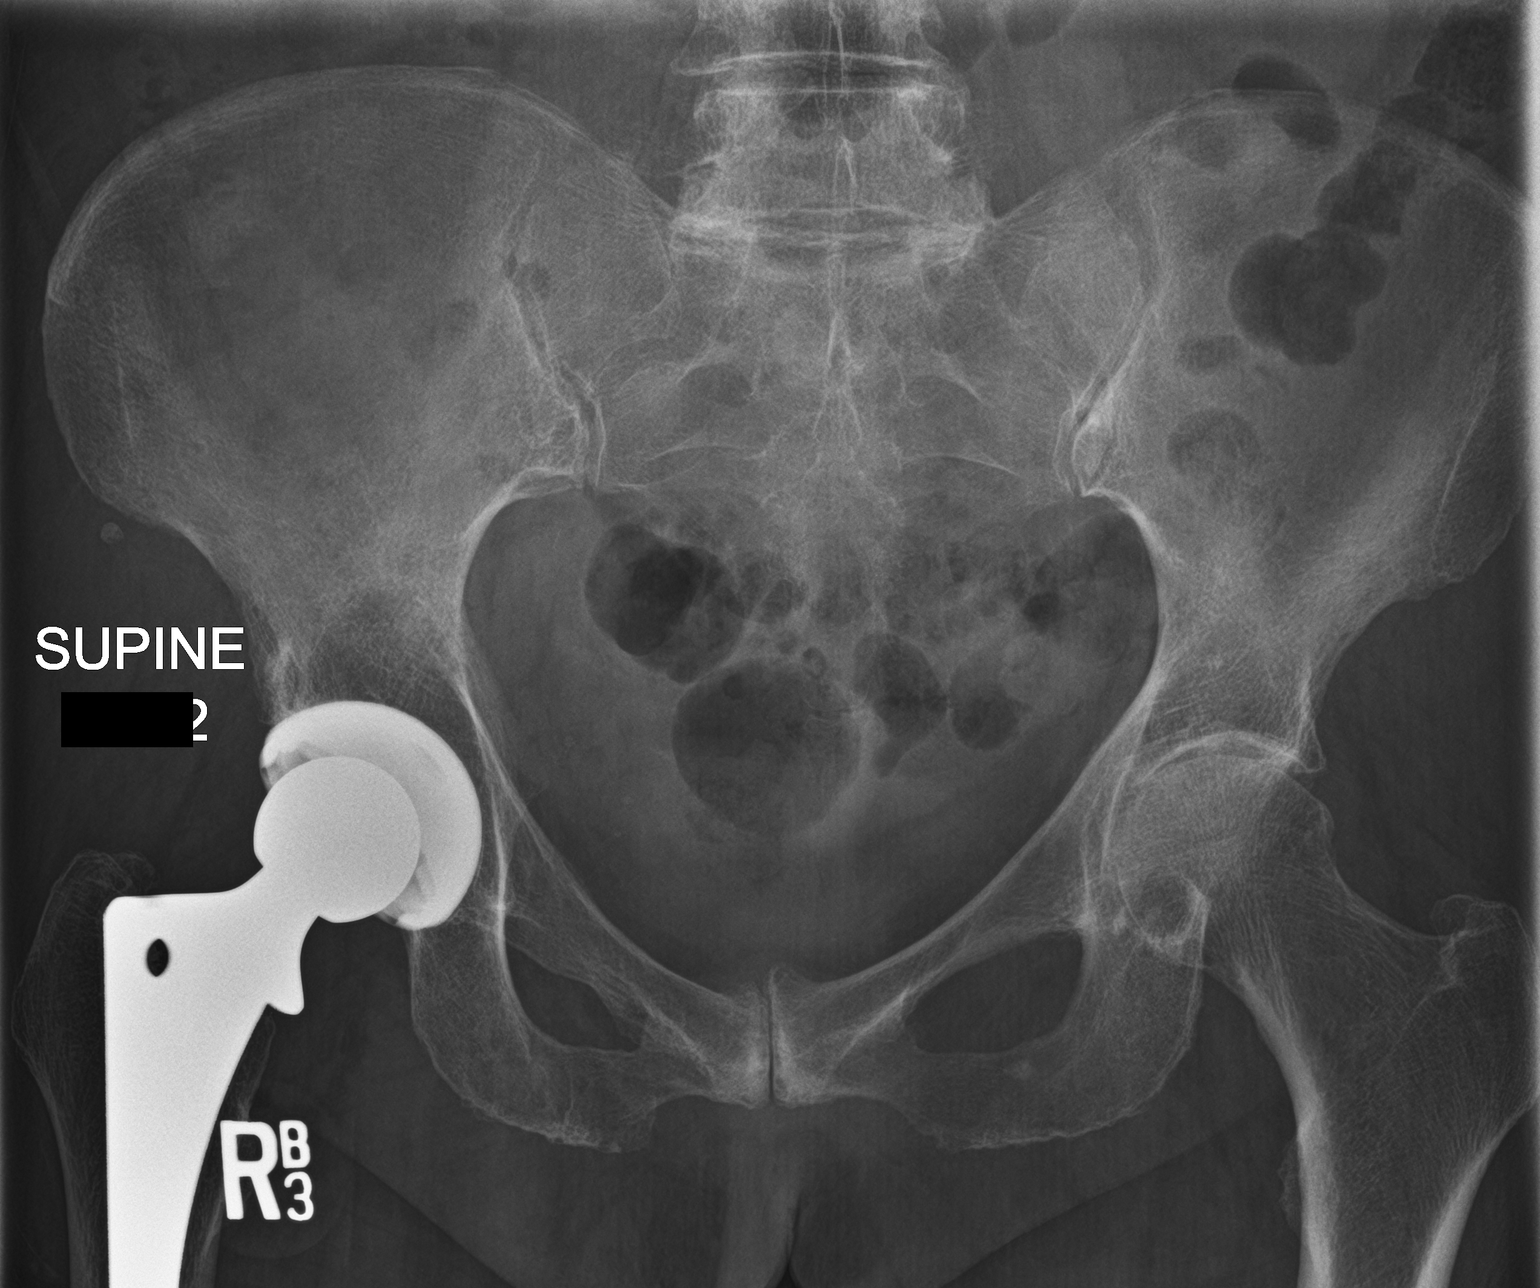

[2 of 2 positions shown; findings below may reference images not displayed]

FINDINGS: There is minimal haziness at the site of the previously fragmented
calculus to the left of L4 which could represent pulverized calculi.
No other calcifications are seen along the expected left ureteral
course. Several right renal calculi remain. There are degenerative
changes in the lumbar spine and right total hip replacement is
present.
IMPRESSION: 1. The left ureteral calculus is not definitely seen and the could
be small fragments at the previous site of that calculus although
that may be artifactual as well.
2. At least 3 right renal calculi remain.

## 2019-07-17 ENCOUNTER — Ambulatory Visit (INDEPENDENT_AMBULATORY_CARE_PROVIDER_SITE_OTHER): Payer: Medicare Other | Admitting: Dermatology

## 2019-07-17 ENCOUNTER — Other Ambulatory Visit: Payer: Self-pay

## 2019-07-17 DIAGNOSIS — L820 Inflamed seborrheic keratosis: Secondary | ICD-10-CM

## 2019-07-17 DIAGNOSIS — L578 Other skin changes due to chronic exposure to nonionizing radiation: Secondary | ICD-10-CM | POA: Diagnosis not present

## 2019-07-17 DIAGNOSIS — C44629 Squamous cell carcinoma of skin of left upper limb, including shoulder: Secondary | ICD-10-CM

## 2019-07-17 DIAGNOSIS — D485 Neoplasm of uncertain behavior of skin: Secondary | ICD-10-CM

## 2019-07-17 NOTE — Progress Notes (Signed)
Follow-Up Visit   Subjective  Tiffany Cole is a 86 y.o. female who presents for the following: Skin Problem.  Patient here today for a spot on left hand. Present for > 2 weeks, sore and growing. There is also a spot on her right lower leg but that has been there a few weeks but no symptoms with that.   The following portions of the chart were reviewed this encounter and updated as appropriate:  Tobacco  Allergies  Meds  Problems  Med Hx  Surg Hx  Fam Hx      Review of Systems:  No other skin or systemic complaints except as noted in HPI or Assessment and Plan.  Objective  Well appearing patient in no apparent distress; mood and affect are within normal limits.  A focused examination was performed including right lower leg, left hand. Relevant physical exam findings are noted in the Assessment and Plan.  Objective  Right pretibial x 1: Erythematous keratotic or waxy stuck-on papule or plaque.   Objective  Left dorsum hand: 1.5cm hyperkeratotic papule   Assessment & Plan  Inflamed seborrheic keratosis Right pretibial x 1  Destruction of lesion - Right pretibial x 1 Complexity: simple   Destruction method: cryotherapy   Informed consent: discussed and consent obtained   Timeout:  patient name, date of birth, surgical site, and procedure verified Lesion destroyed using liquid nitrogen: Yes   Region frozen until ice ball extended beyond lesion: Yes   Outcome: patient tolerated procedure well with no complications   Post-procedure details: wound care instructions given    Neoplasm of uncertain behavior of skin Left dorsum hand  Epidermal / dermal shaving  Lesion diameter (cm):  1.5 Informed consent: discussed and consent obtained   Timeout: patient name, date of birth, surgical site, and procedure verified   Procedure prep:  Patient was prepped and draped in usual sterile fashion Prep type:  Isopropyl alcohol Anesthesia: the lesion was anesthetized in a  standard fashion   Anesthetic:  1% lidocaine w/ epinephrine 1-100,000 buffered w/ 8.4% NaHCO3 Instrument used: flexible razor blade   Hemostasis achieved with: pressure, aluminum chloride and electrodesiccation   Outcome: patient tolerated procedure well   Post-procedure details: sterile dressing applied and wound care instructions given   Dressing type: bandage and petrolatum    Destruction of lesion Complexity: extensive   Destruction method: electrodesiccation and curettage   Informed consent: discussed and consent obtained   Timeout:  patient name, date of birth, surgical site, and procedure verified Procedure prep:  Patient was prepped and draped in usual sterile fashion Prep type:  Isopropyl alcohol Anesthesia: the lesion was anesthetized in a standard fashion   Anesthetic:  1% lidocaine w/ epinephrine 1-100,000 buffered w/ 8.4% NaHCO3 Curettage performed in three different directions: Yes   Electrodesiccation performed over the curetted area: Yes   Lesion length (cm):  1.5 Lesion width (cm):  1.5 Margin per side (cm):  0.2 Final wound size (cm):  1.9 Hemostasis achieved with:  pressure, aluminum chloride and electrodesiccation Outcome: patient tolerated procedure well with no complications   Post-procedure details: sterile dressing applied and wound care instructions given   Dressing type: bandage and petrolatum    Specimen 1 - Surgical pathology Differential Diagnosis: r/o SCC Check Margins: No 1.5cm hyperkeratotic papule  Actinic Damage - diffuse scaly erythematous macules with underlying dyspigmentation - Recommend daily broad spectrum sunscreen SPF 30+ to sun-exposed areas, reapply every 2 hours as needed.  - Call for new or  changing lesions.   Return in about 3 months (around 10/17/2019).  [name], RMA, am acting as scribe for [name], MD . Documentation: I have reviewed the above documentation for accuracy and completeness, and I agree with the  above.  [name], MD

## 2019-07-17 NOTE — Patient Instructions (Signed)

## 2019-07-21 ENCOUNTER — Encounter: Payer: Self-pay | Admitting: Dermatology

## 2019-07-22 ENCOUNTER — Telehealth: Payer: Self-pay

## 2019-07-22 NOTE — Telephone Encounter (Signed)
Patient informed of pathology results 

## 2019-07-22 NOTE — Telephone Encounter (Signed)
-----   Message from Ralene Bathe, MD sent at 07/21/2019  5:56 PM EDT ----- Skin , left dorsum hand WELL DIFFERENTIATED SQUAMOUS CELL CARCINOMA  Cancer - SCC Already treated Recheck next visit

## 2019-08-27 ENCOUNTER — Ambulatory Visit: Payer: Medicare Other | Admitting: Dermatology

## 2019-10-01 ENCOUNTER — Ambulatory Visit (INDEPENDENT_AMBULATORY_CARE_PROVIDER_SITE_OTHER): Payer: Medicare Other | Admitting: Dermatology

## 2019-10-01 ENCOUNTER — Other Ambulatory Visit: Payer: Self-pay

## 2019-10-01 DIAGNOSIS — L578 Other skin changes due to chronic exposure to nonionizing radiation: Secondary | ICD-10-CM | POA: Diagnosis not present

## 2019-10-01 DIAGNOSIS — L821 Other seborrheic keratosis: Secondary | ICD-10-CM

## 2019-10-01 DIAGNOSIS — L570 Actinic keratosis: Secondary | ICD-10-CM | POA: Diagnosis not present

## 2019-10-01 DIAGNOSIS — L820 Inflamed seborrheic keratosis: Secondary | ICD-10-CM

## 2019-10-01 NOTE — Patient Instructions (Signed)
Cryotherapy Aftercare  . Wash gently with soap and water everyday.   . Apply Vaseline and Band-Aid daily until healed.  

## 2019-10-01 NOTE — Progress Notes (Signed)
   Follow-Up Visit   Subjective  Tiffany Cole is a 86 y.o. female who presents for the following: Skin Problem (Check a growth on the L leg growing x 1 month) and Actinic Keratosis (check scaly places on the face).  The following portions of the chart were reviewed this encounter and updated as appropriate:  Tobacco  Allergies  Meds  Problems  Med Hx  Surg Hx  Fam Hx     Review of Systems:  No other skin or systemic complaints except as noted in HPI or Assessment and Plan.  Objective  Well appearing patient in no apparent distress; mood and affect are within normal limits.  A focused examination was performed including face, L thigh . Relevant physical exam findings are noted in the Assessment and Plan.  Objective  Left Thigh - Anterior: Erythematous keratotic or waxy stuck-on papule or plaque.   Objective  L cheek: Erythematous thin papules/macules with gritty scale.    Assessment & Plan  Inflamed seborrheic keratosis Left Thigh - Anterior  Destruction of lesion - Left Thigh - Anterior Complexity: simple   Destruction method: cryotherapy   Informed consent: discussed and consent obtained   Timeout:  patient name, date of birth, surgical site, and procedure verified Lesion destroyed using liquid nitrogen: Yes   Region frozen until ice ball extended beyond lesion: Yes   Outcome: patient tolerated procedure well with no complications   Post-procedure details: wound care instructions given    AK (actinic keratosis) L cheek  Destruction of lesion - L cheek Complexity: simple   Destruction method: cryotherapy   Informed consent: discussed and consent obtained   Timeout:  patient name, date of birth, surgical site, and procedure verified Lesion destroyed using liquid nitrogen: Yes   Region frozen until ice ball extended beyond lesion: Yes   Outcome: patient tolerated procedure well with no complications   Post-procedure details: wound care instructions given     Actinic Damage - diffuse scaly erythematous macules with underlying dyspigmentation - Recommend daily broad spectrum sunscreen SPF 30+ to sun-exposed areas, reapply every 2 hours as needed.  - Call for new or changing lesions.  Seborrheic Keratoses - Stuck-on, waxy, tan-brown papules and plaques  - Discussed benign etiology and prognosis. - Observe - Call for any changes   Return for as scheduled in Oct 2021.  IMarye Round, CMA, am acting as scribe for [name], MD .  Documentation: I have reviewed the above documentation for accuracy and completeness, and I agree with the above.  [name], MD

## 2019-10-08 ENCOUNTER — Encounter: Payer: Self-pay | Admitting: Dermatology

## 2019-11-19 ENCOUNTER — Ambulatory Visit (INDEPENDENT_AMBULATORY_CARE_PROVIDER_SITE_OTHER): Payer: Medicare Other | Admitting: Dermatology

## 2019-11-19 ENCOUNTER — Other Ambulatory Visit: Payer: Self-pay

## 2019-11-19 DIAGNOSIS — L720 Epidermal cyst: Secondary | ICD-10-CM

## 2019-11-19 DIAGNOSIS — Z872 Personal history of diseases of the skin and subcutaneous tissue: Secondary | ICD-10-CM | POA: Diagnosis not present

## 2019-11-19 DIAGNOSIS — L578 Other skin changes due to chronic exposure to nonionizing radiation: Secondary | ICD-10-CM

## 2019-11-19 DIAGNOSIS — L820 Inflamed seborrheic keratosis: Secondary | ICD-10-CM | POA: Diagnosis not present

## 2019-11-19 MED ORDER — MUPIROCIN 2 % EX OINT: 1.0000 "application " | TOPICAL_OINTMENT | Freq: Every day | CUTANEOUS | 0 refills | Status: DC

## 2019-11-19 NOTE — Progress Notes (Signed)
   Follow-Up Visit   Subjective  Tiffany Cole is a 86 y.o. female who presents for the following: Follow-up (AK follow up of left cheek treated with LN2). She has noticed other spots the face she would like checked.  The following portions of the chart were reviewed this encounter and updated as appropriate:  Tobacco  Allergies  Meds  Problems  Med Hx  Surg Hx  Fam Hx     Review of Systems:  No other skin or systemic complaints except as noted in HPI or Assessment and Plan.  Objective  Well appearing patient in no apparent distress; mood and affect are within normal limits.  A focused examination was performed including face. Relevant physical exam findings are noted in the Assessment and Plan.  Objective  Head - Anterior (Face): Smooth white papule(s).   Objective  Left cheek: Clear today.  Objective  Right cheek: Erythematous keratotic or waxy stuck-on papule or plaque.    Assessment & Plan  Milia Head - Anterior (Face)  Benign, observe.    History of actinic keratoses Left cheek  Resolved  Inflamed seborrheic keratosis Right cheek  Destruction of lesion - Right cheek Complexity: simple   Destruction method: cryotherapy   Informed consent: discussed and consent obtained   Timeout:  patient name, date of birth, surgical site, and procedure verified Lesion destroyed using liquid nitrogen: Yes   Region frozen until ice ball extended beyond lesion: Yes   Outcome: patient tolerated procedure well with no complications   Post-procedure details: wound care instructions given    mupirocin ointment (BACTROBAN) 2 % - Right cheek  Actinic Damage - diffuse scaly erythematous macules with underlying dyspigmentation - Recommend daily broad spectrum sunscreen SPF 30+ to sun-exposed areas, reapply every 2 hours as needed.  - Call for new or changing lesions.  Return in about 4 months (around 03/21/2020).  I, Ashok Cordia, CMA, am acting as scribe for [name], MD .  Documentation: I have reviewed the above documentation for accuracy and completeness, and I agree with the above.  [name], MD

## 2019-11-19 NOTE — Patient Instructions (Signed)

## 2019-11-20 ENCOUNTER — Encounter: Payer: Self-pay | Admitting: Dermatology

## 2019-12-06 IMAGING — US US RENAL
1 series · 14 of 25 positions shown · non-contrast
Comparison: 09/06/2016 CT of the abdomen, 07/19/2016 ultrasound of
the kidneys

CLINICAL DATA: History of nephrolithiasis

EXAM:
RENAL / URINARY TRACT ULTRASOUND COMPLETE

[Series 1: us renal · 0.23mm/px · 14 of 44 slices shown]
[im 1/44]
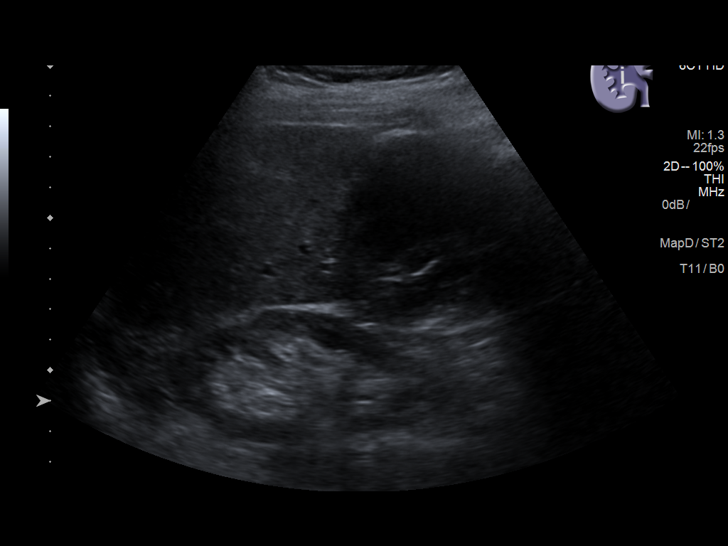
[im 4/44]
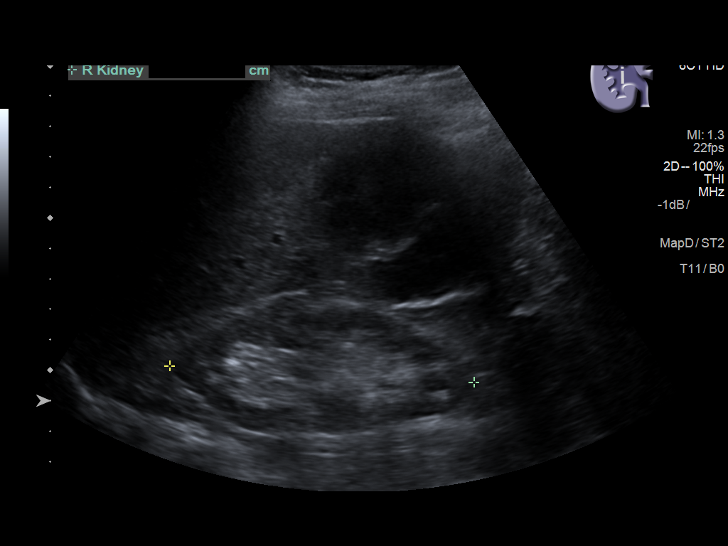
[im 8/44]
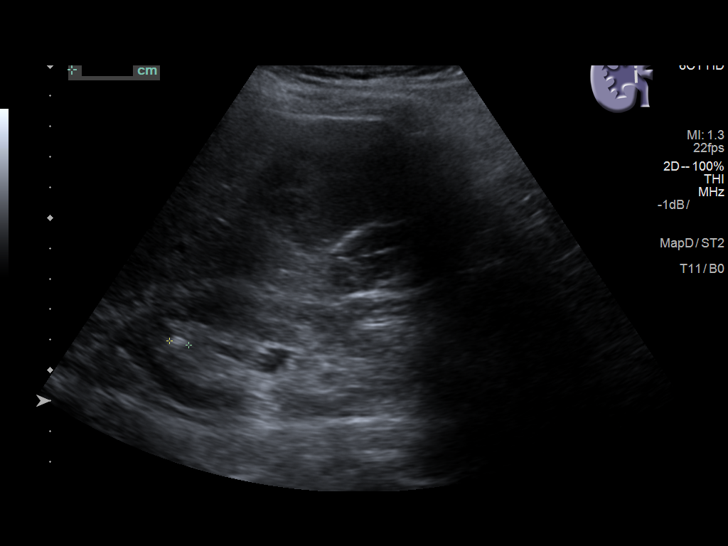
[im 11/44]
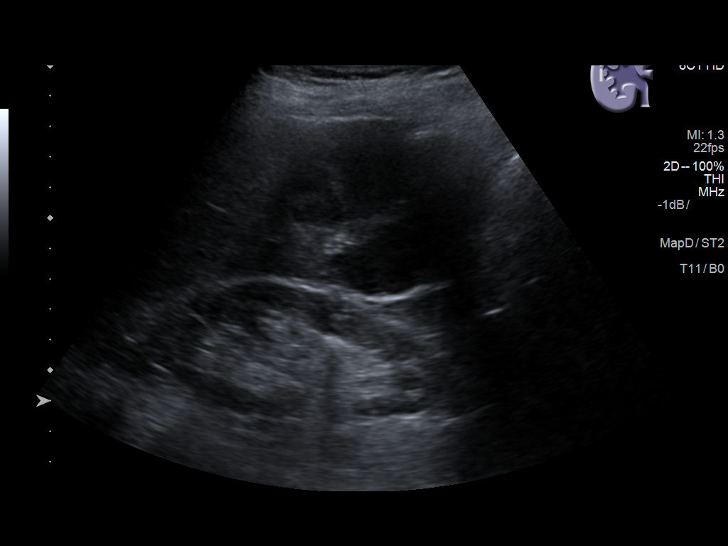
[im 15/44]
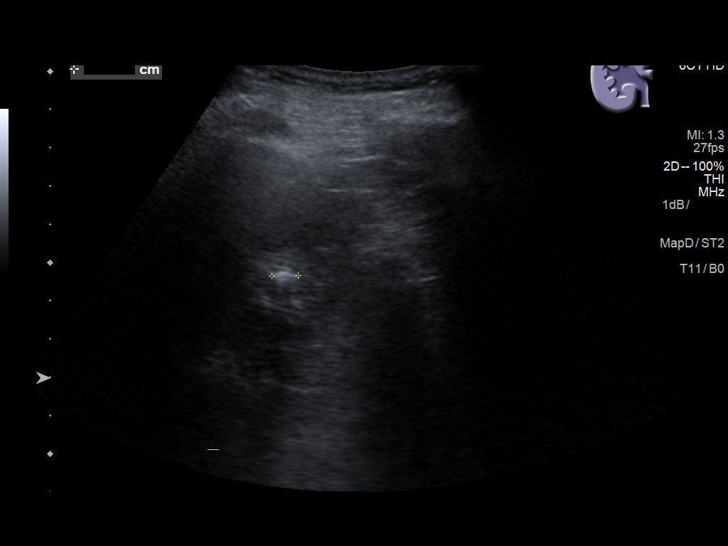
[im 17/44]
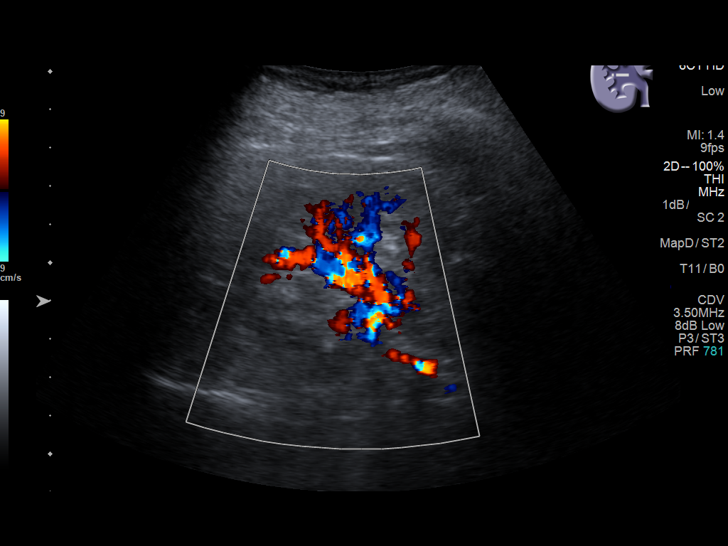
[im 20/44]
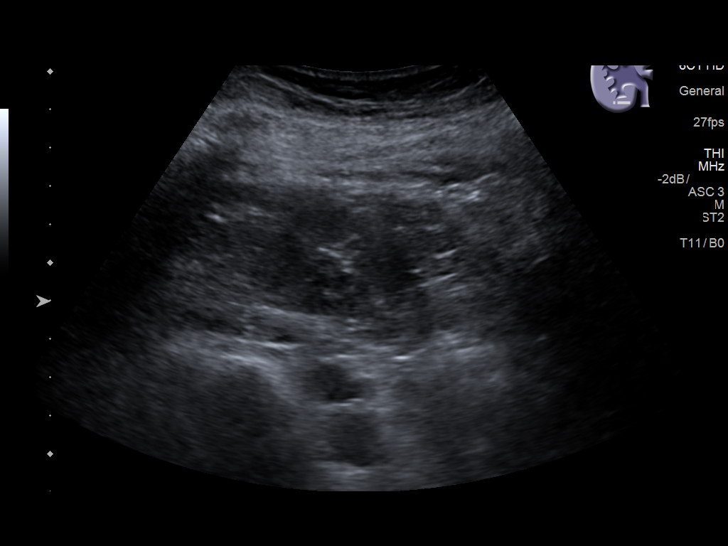
[im 24/44]
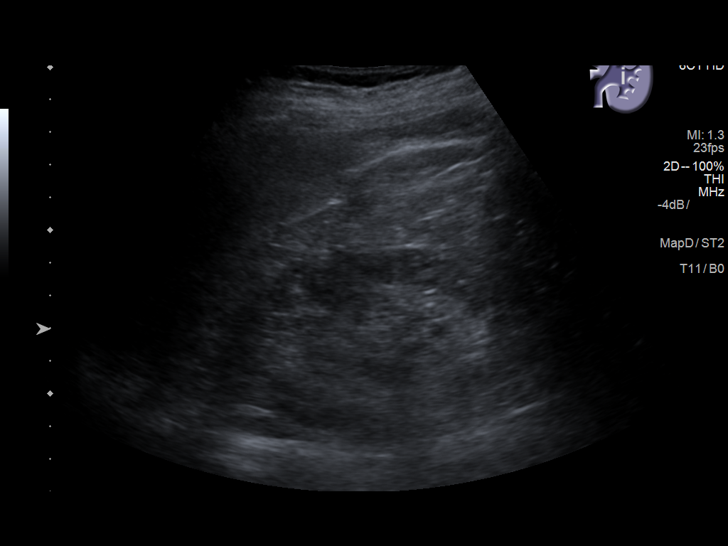
[im 27/44]
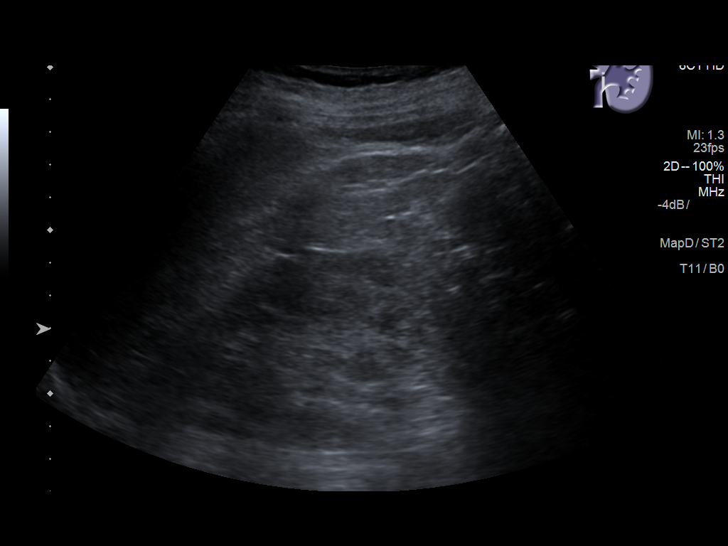
[im 29/44]
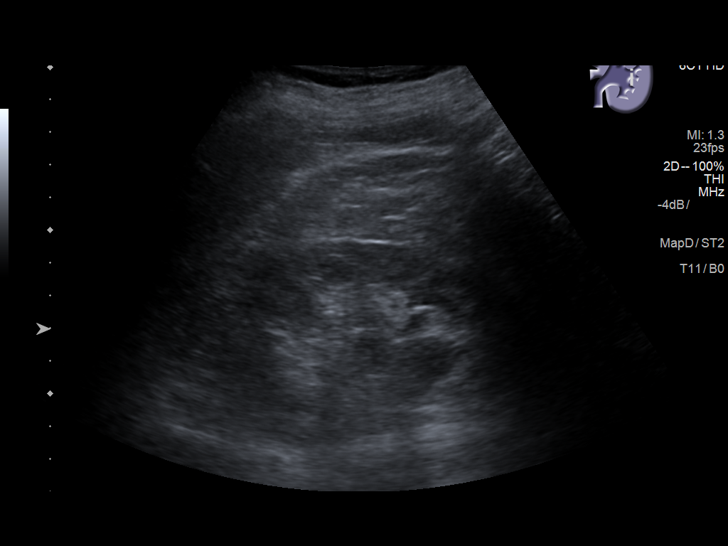
[im 33/44]
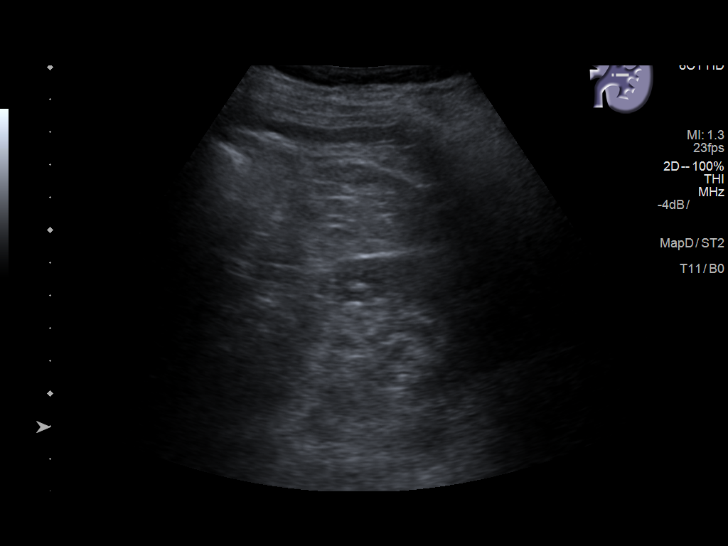
[im 36/44]
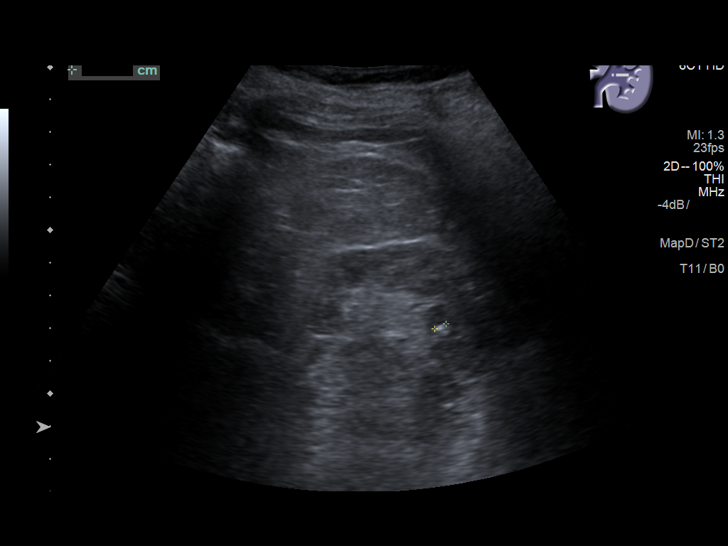
[im 40/44]
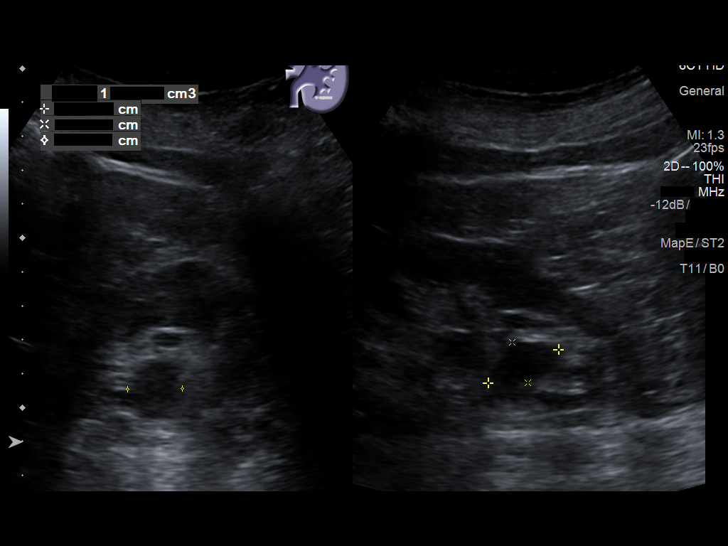
[im 44/44]
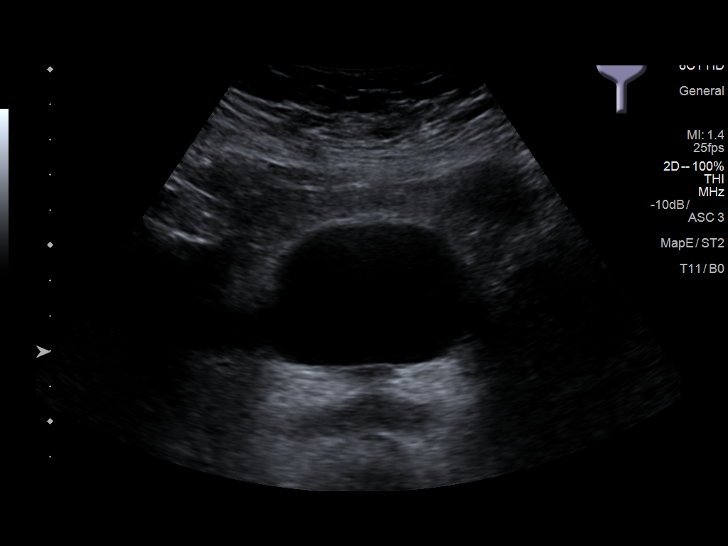

[14 of 25 positions shown; findings below may reference images not displayed]

FINDINGS: Right Kidney:

Length: 10 cm. Mild cortical thinning is noted. There is a 1.2 cm
cyst in the lower pole of the right kidney stable from previous CT.
Multiple calculi are identified. The largest of these left measures
7 mm

Left Kidney:

Length: 10 cm. No mass lesion or hydronephrosis is noted. A
parapelvic cyst is noted measuring 2.3 cm stable from previous exam.
Echogenic foci are noted consistent with renal calculi. No
obstructive changes are noted.

Bladder:

Appears normal for degree of bladder distention.
IMPRESSION: Bilateral renal calculi.

Bilateral renal cysts.

No acute abnormality noted.

## 2020-01-17 DIAGNOSIS — M1711 Unilateral primary osteoarthritis, right knee: Secondary | ICD-10-CM | POA: Insufficient documentation

## 2020-02-08 ENCOUNTER — Telehealth: Payer: Self-pay | Admitting: Physician Assistant

## 2020-02-08 NOTE — Telephone Encounter (Signed)
Called to discuss with patient about Covid symptoms and the use of a monoclonal antibody infusion for those with mild to moderate Covid symptoms and at a high risk of hospitalization.   Pt is qualified for the monoclonal antibody infusion, but due to medication shortages we cannot offer the pt the infusion at this time. This decision was based on clinical judgement as well as the the [employer] Covid 19 treatment guidelines for treatment prioritization and equitable access. Symptoms tier reviewed as well as criteria for ending isolation. Preventative practices reviewed. Patient verbalized understanding.   Pt out >7 days from symptoms onset.    Cline Crock, PA-C  02/08/2020 1:22 PM

## 2020-02-16 DIAGNOSIS — Z8616 Personal history of COVID-19: Secondary | ICD-10-CM | POA: Insufficient documentation

## 2020-03-15 ENCOUNTER — Other Ambulatory Visit: Payer: Self-pay

## 2020-03-15 ENCOUNTER — Ambulatory Visit (INDEPENDENT_AMBULATORY_CARE_PROVIDER_SITE_OTHER): Payer: Medicare Other | Admitting: Dermatology

## 2020-03-15 DIAGNOSIS — N764 Abscess of vulva: Secondary | ICD-10-CM | POA: Diagnosis not present

## 2020-03-15 DIAGNOSIS — L820 Inflamed seborrheic keratosis: Secondary | ICD-10-CM | POA: Diagnosis not present

## 2020-03-15 DIAGNOSIS — L0292 Furuncle, unspecified: Secondary | ICD-10-CM

## 2020-03-15 MED ORDER — DOXYCYCLINE MONOHYDRATE 100 MG PO CAPS: 100.0000 mg | ORAL_CAPSULE | Freq: Two times a day (BID) | ORAL | 3 refills | Status: AC

## 2020-03-15 MED ORDER — MUPIROCIN 2 % EX OINT: 1.0000 "application " | TOPICAL_OINTMENT | Freq: Every day | CUTANEOUS | 2 refills | Status: DC

## 2020-03-15 NOTE — Patient Instructions (Signed)
Doxycycline should be taken with food to prevent nausea. Do not lay down for 30 minutes after taking. Be cautious with sun exposure and use good sun protection while on this medication. Pregnant women should not take this medication.  

## 2020-03-15 NOTE — Progress Notes (Unsigned)
   Follow-Up Visit   Subjective  Tiffany Cole is a 87 y.o. female who presents for the following: abscess (Patient here today for an area at groin about 10 days ago. No drainage but area is tender and has gotten larger. ).  The following portions of the chart were reviewed this encounter and updated as appropriate:   Tobacco  Allergies  Meds  Problems  Med Hx  Surg Hx  Fam Hx     Review of Systems:  No other skin or systemic complaints except as noted in HPI or Assessment and Plan.  Objective  Well appearing patient in no apparent distress; mood and affect are within normal limits.  A focused examination was performed including face, groin. Relevant physical exam findings are noted in the Assessment and Plan.  Objective  labia at perineum: 1.0cm hard nodule    Assessment & Plan  Furunculosis with pain labia at perineum Start doxycycline 100mg  twice daily with food x 10 days #20 3RF Start mupirocin ointment daily.   Doxycycline should be taken with food to prevent nausea. Do not lay down for 30 minutes after taking. Be cautious with sun exposure and use good sun protection while on this medication. Pregnant women should not take this medication.   Ordered Medications: mupirocin ointment (BACTROBAN) 2 % doxycycline (MONODOX) 100 MG capsule  Return if symptoms worsen or fail to improve.  [name], RMA, am acting as scribe for [name], MD . Documentation: I have reviewed the above documentation for accuracy and completeness, and I agree with the above.  [name], MD

## 2020-03-18 ENCOUNTER — Encounter: Payer: Self-pay | Admitting: Dermatology

## 2020-03-29 ENCOUNTER — Ambulatory Visit: Payer: Medicare Other | Admitting: Dermatology

## 2020-06-03 ENCOUNTER — Ambulatory Visit: Payer: Medicare Other | Admitting: Dermatology

## 2020-07-22 ENCOUNTER — Other Ambulatory Visit: Payer: Self-pay | Admitting: Family Medicine

## 2020-07-22 DIAGNOSIS — R519 Headache, unspecified: Secondary | ICD-10-CM

## 2020-07-22 DIAGNOSIS — R42 Dizziness and giddiness: Secondary | ICD-10-CM

## 2020-08-04 ENCOUNTER — Other Ambulatory Visit: Payer: Self-pay

## 2020-08-04 ENCOUNTER — Ambulatory Visit
Admission: RE | Admit: 2020-08-04 | Discharge: 2020-08-04 | Disposition: A | Discharge status: Home / Self Care | Payer: Medicare Other | Source: Ambulatory Visit | Attending: Family Medicine | Admitting: Family Medicine

## 2020-08-04 DIAGNOSIS — R42 Dizziness and giddiness: Secondary | ICD-10-CM | POA: Diagnosis present

## 2020-08-04 DIAGNOSIS — R519 Headache, unspecified: Secondary | ICD-10-CM | POA: Insufficient documentation

## 2020-08-04 MED ORDER — GADOBUTROL 1 MMOL/ML IV SOLN
6.0000 mL | Freq: Once | INTRAVENOUS | Status: AC | PRN
  Administered 2020-08-04: 6 mL via INTRAVENOUS

## 2020-11-10 ENCOUNTER — Other Ambulatory Visit: Payer: Self-pay

## 2020-11-10 ENCOUNTER — Ambulatory Visit (INDEPENDENT_AMBULATORY_CARE_PROVIDER_SITE_OTHER): Payer: Medicare Other | Admitting: Dermatology

## 2020-11-10 DIAGNOSIS — Z86018 Personal history of other benign neoplasm: Secondary | ICD-10-CM

## 2020-11-10 DIAGNOSIS — L720 Epidermal cyst: Secondary | ICD-10-CM

## 2020-11-10 DIAGNOSIS — L814 Other melanin hyperpigmentation: Secondary | ICD-10-CM

## 2020-11-10 DIAGNOSIS — L570 Actinic keratosis: Secondary | ICD-10-CM

## 2020-11-10 DIAGNOSIS — L820 Inflamed seborrheic keratosis: Secondary | ICD-10-CM | POA: Diagnosis not present

## 2020-11-10 DIAGNOSIS — Z1283 Encounter for screening for malignant neoplasm of skin: Secondary | ICD-10-CM | POA: Diagnosis not present

## 2020-11-10 DIAGNOSIS — D229 Melanocytic nevi, unspecified: Secondary | ICD-10-CM

## 2020-11-10 DIAGNOSIS — L821 Other seborrheic keratosis: Secondary | ICD-10-CM

## 2020-11-10 DIAGNOSIS — L578 Other skin changes due to chronic exposure to nonionizing radiation: Secondary | ICD-10-CM

## 2020-11-10 DIAGNOSIS — D1801 Hemangioma of skin and subcutaneous tissue: Secondary | ICD-10-CM | POA: Diagnosis not present

## 2020-11-10 DIAGNOSIS — Z85828 Personal history of other malignant neoplasm of skin: Secondary | ICD-10-CM

## 2020-11-10 NOTE — Patient Instructions (Addendum)

## 2020-11-10 NOTE — Progress Notes (Signed)
Follow-Up Visit   Subjective  Tiffany Cole is a 87 y.o. female who presents for the following: check spot (R leg, 61m, no symptoms) and Total body skin exam (Hx of SCC's, hx of AKs). The patient presents for Total-Body Skin Exam (TBSE) for skin cancer screening and mole check.   The following portions of the chart were reviewed this encounter and updated as appropriate:   Tobacco  Allergies  Meds  Problems  Med Hx  Surg Hx  Fam Hx     Review of Systems:  No other skin or systemic complaints except as noted in HPI or Assessment and Plan.  Objective  Well appearing patient in no apparent distress; mood and affect are within normal limits.  A full examination was performed including scalp, head, eyes, ears, nose, lips, neck, chest, axillae, abdomen, back, buttocks, bilateral upper extremities, bilateral lower extremities, hands, feet, fingers, toes, fingernails, and toenails. All findings within normal limits unless otherwise noted below.  legs/arms/hands x 5, Total = 5 (5) Erythematous keratotic or waxy stuck-on papule or plaque.   chest/arms/hands x 16 (16) Pink scaly macules   chest Cystic pap 0.4cm   Assessment & Plan  Inflamed seborrheic keratosis legs/arms/hands x 5, Total = 5  Destruction of lesion - legs/arms/hands x 5, Total = 5 Complexity: simple   Destruction method: cryotherapy   Informed consent: discussed and consent obtained   Timeout:  patient name, date of birth, surgical site, and procedure verified Lesion destroyed using liquid nitrogen: Yes   Region frozen until ice ball extended beyond lesion: Yes   Outcome: patient tolerated procedure well with no complications   Post-procedure details: wound care instructions given    Related Medications mupirocin ointment (BACTROBAN) 2 % Apply 1 application topically daily. With dressing changes  AK (actinic keratosis) (16) chest/arms/hands x 16  Destruction of lesion - chest/arms/hands x 16 Complexity:  simple   Destruction method: cryotherapy   Informed consent: discussed and consent obtained   Timeout:  patient name, date of birth, surgical site, and procedure verified Lesion destroyed using liquid nitrogen: Yes   Region frozen until ice ball extended beyond lesion: Yes   Outcome: patient tolerated procedure well with no complications   Post-procedure details: wound care instructions given    Epidermal cyst chest  Benign, observe  Skin cancer screening  Lentigines - Scattered tan macules - Due to sun exposure - Benign-appearing, observe - Recommend daily broad spectrum sunscreen SPF 30+ to sun-exposed areas, reapply every 2 hours as needed. - Call for any changes  Seborrheic Keratoses - Stuck-on, waxy, tan-brown papules and/or plaques  - Benign-appearing - Discussed benign etiology and prognosis. - Observe - Call for any changes  Melanocytic Nevi - Tan-brown and/or pink-flesh-colored symmetric macules and papules - Benign appearing on exam today - Observation - Call clinic for new or changing moles - Recommend daily use of broad spectrum spf 30+ sunscreen to sun-exposed areas.   Hemangiomas - Red papules - Discussed benign nature - Observe - Call for any changes  Actinic Damage - Chronic condition, secondary to cumulative UV/sun exposure - diffuse scaly erythematous macules with underlying dyspigmentation - Recommend daily broad spectrum sunscreen SPF 30+ to sun-exposed areas, reapply every 2 hours as needed.  - Staying in the shade or wearing long sleeves, sun glasses (UVA+UVB protection) and wide brim hats (4-inch brim around the entire circumference of the hat) are also recommended for sun protection.  - Call for new or changing lesions.  History of  Squamous Cell Carcinoma of the Skin - No evidence of recurrence today - No lymphadenopathy - Recommend regular full body skin exams - Recommend daily broad spectrum sunscreen SPF 30+ to sun-exposed areas,  reapply every 2 hours as needed.  - Call if any new or changing lesions are noted between office visits - multiple  Skin cancer screening performed today.  Milia - tiny firm white papules - type of cyst - benign - may be extracted if symptomatic - observe   Return in about 1 year (around 11/10/2021) for TBSE, Hx of SCC, AK f/u.  I, Othelia Pulling, RMA, am acting as scribe for [name], MD . Documentation: I have reviewed the above documentation for accuracy and completeness, and I agree with the above.  [name], MD

## 2020-11-16 ENCOUNTER — Encounter: Payer: Self-pay | Admitting: Dermatology

## 2021-01-11 NOTE — H&P (View-Only) (Signed)
01/12/21 3:09 PM   Tiffany Cole Jun 13, 1928 [id number]  Referring provider:  Dion Body, MD [facility] [place],  [place] [address] Chief Complaint  Patient presents with   Hematuria     HPI: Tiffany Cole is a 87 y.o.female who presents today for further evaluation of abdominal pain and blood in urine.   She has a personal history of kidney stones. She was last seen in clinic on 10/25/2017 by Zara Council, PA-C. She is s/p ESWL on 10/11/2017 for removal of 9 mm stone. She has a previous ESWL on 05/25/2016.   She also underwent a Litholink in 2019.   KUB was personally reviewed today and revealed a 10 mm right UPJ/ proximal ureteral stone and multiple bilateral nonobstructing stones.   She reports that she has been having right flank pain for the past few weeks.  This was consistent with her previous stone episodes.  The past, she has had success with spontaneous passage when she thought this would pass but continues to have issues.    No fevers or chills.  Urinary tract symptoms.  She did have intermittent gross hematuria related to the flank pain.  She is an anxious to have the stone treated refer the holidays.  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 Left pretibial   Squamous cell carcinoma of left lower leg 09/07/2014   Well differentiated SCC with superficial infiltration.  Left pretibial   Squamous cell carcinoma of left lower leg 01/01/2017   Well differentiated SCC with superficial infiltration.  Left pretibial mid.   Squamous cell carcinoma of right lower leg  01/07/2008   Well differentiated SCC. Right lower leg   Squamous cell carcinoma of right lower leg 07/23/2012   Well differentiated SCC with superficial infiltration. Right pretibial   Squamous cell carcinoma of right lower leg 02/27/2019   SCCis, hypertrophic. Right distal lateral pretibial.   Squamous cell carcinoma of right lower leg 02/27/2019   Well differentiated SCC. Right distal medial pretibial.   Squamous cell carcinoma of skin 10/01/2006   Well differentiated SCC with superficial infiltration. Inferior lateral pretibial.   Squamous cell carcinoma of skin 04/10/2019   Well differentiated SCC with superficial infiltration. Left distal anterior thigh.   Squamous cell carcinoma of skin 07/17/2019   Left dorsum hand. WD SCC, deep margin involved.    Surgical History: Past Surgical History:  Procedure Laterality Date   ANKLE SURGERY     APPENDECTOMY     EXTRACORPOREAL SHOCK WAVE LITHOTRIPSY Right 05/25/2016   Procedure: EXTRACORPOREAL SHOCK WAVE LITHOTRIPSY (ESWL);  Surgeon: Hollice Espy, MD;  Location: [facility] ORS;  Service: Urology;  Laterality: Right;   EXTRACORPOREAL SHOCK WAVE LITHOTRIPSY Left 10/11/2017   Procedure: EXTRACORPOREAL SHOCK WAVE LITHOTRIPSY (ESWL);  Surgeon: Hollice Espy, MD;  Location: [facility] ORS;  Service: Urology;  Laterality: Left;   JOINT REPLACEMENT Left 2016   knee   TONSILLECTOMY     TUBAL LIGATION      Home Medications:  Allergies as of 01/12/2021       Reactions   Diazepam Other (See Comments)  Stops breathing (oversedation)        Medication List        Accurate as of January 12, 2021  3:09 PM. If you have any questions, ask your nurse or doctor.          meclizine 12.5 MG tablet Commonly known as: ANTIVERT Take 12.5 mg by mouth 3 (three) times daily as needed for dizziness.   mupirocin ointment 2 % Commonly known as: BACTROBAN Apply 1 application topically daily. With dressing changes What changed: Another medication with the  same name was removed. Continue taking this medication, and follow the directions you see here. Changed by: Hollice Espy, MD        Allergies:  Allergies  Allergen Reactions   Diazepam Other (See Comments)    Stops breathing (oversedation)    Family History: Family History  Problem Relation Age of Onset   Prostate cancer Brother    Kidney cancer Neg Hx    Bladder Cancer Neg Hx    Breast cancer Neg Hx     Social History:  reports that she has never smoked. She has never used smokeless tobacco. She reports that she does not drink alcohol and does not use drugs.   Physical Exam: BP (!) 154/75   Pulse (!) 59   Ht 5\' 5"  (1.651 m)   Wt 130 lb (59 kg)   BMI 21.63 kg/m   Constitutional:  Alert and oriented, No acute distress. HEENT: [place] AT, moist mucus membranes.  Trachea midline, no masses. Cardiovascular: No clubbing, cyanosis, or edema. Respiratory: Normal respiratory effort, no increased work of breathing. Skin: No rashes, bruises or suspicious lesions. Neurologic: Grossly intact, no focal deficits, moving all 4 extremities. Psychiatric: Normal mood and affect.  Laboratory Data:  Lab Results  Component Value Date   CREATININE 0.68 03/28/2017   Urinalysis - 6-10 wbcs, 11-30 rbcs, nitrite negative, few bacteria   Pertinent Imaging: KUB was personally reviewed.    Assessment & Plan:    Right proximal UPJ stone  - 10 mm in size - Discussed with her today that the stone is too large for interval passage. She has done well with ESWL before and would like to proceed with this. She will return tomorrow for ESWL.  -Alternatives including ureteroscopy were also discussed.  She prefers to the above. - Will further discuss treatment of nonobstructing stones after ESWL  - Urine sent for pre-operative culture   Return tomorrow (01/13/2021) for ESWL   I,Kailey Littlejohn,acting as a scribe for Hollice Espy, MD.,have documented all relevant documentation on the behalf  of Hollice Espy, MD,as directed by  Hollice Espy, MD while in the presence of Hollice Espy, MD.  I have reviewed the above documentation for accuracy and completeness, and I agree with the above.   Hollice Espy, MD  [facility] [address], [place], [place] [address] [phone]

## 2021-01-11 NOTE — Progress Notes (Signed)
01/12/21 3:09 PM   Tiffany Cole 1928/10/13 [id number]  Referring provider:  Dion Body, MD [facility] [place],  [place] [address] Chief Complaint  Patient presents with   Hematuria     HPI: Tiffany Cole is a 87 y.o.female who presents today for further evaluation of abdominal pain and blood in urine.   She has a personal history of kidney stones. She was last seen in clinic on 10/25/2017 by Zara Council, PA-C. She is s/p ESWL on 10/11/2017 for removal of 9 mm stone. She has a previous ESWL on 05/25/2016.   She also underwent a Litholink in 2019.   KUB was personally reviewed today and revealed a 10 mm right UPJ/ proximal ureteral stone and multiple bilateral nonobstructing stones.   She reports that she has been having right flank pain for the past few weeks.  This was consistent with her previous stone episodes.  The past, she has had success with spontaneous passage when she thought this would pass but continues to have issues.    No fevers or chills.  Urinary tract symptoms.  She did have intermittent gross hematuria related to the flank pain.  She is an anxious to have the stone treated refer the holidays.  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 Left pretibial   Squamous cell carcinoma of left lower leg 09/07/2014   Well differentiated SCC with superficial infiltration.  Left pretibial   Squamous cell carcinoma of left lower leg 01/01/2017   Well differentiated SCC with superficial infiltration.  Left pretibial mid.   Squamous cell carcinoma of right lower leg  01/07/2008   Well differentiated SCC. Right lower leg   Squamous cell carcinoma of right lower leg 07/23/2012   Well differentiated SCC with superficial infiltration. Right pretibial   Squamous cell carcinoma of right lower leg 02/27/2019   SCCis, hypertrophic. Right distal lateral pretibial.   Squamous cell carcinoma of right lower leg 02/27/2019   Well differentiated SCC. Right distal medial pretibial.   Squamous cell carcinoma of skin 10/01/2006   Well differentiated SCC with superficial infiltration. Inferior lateral pretibial.   Squamous cell carcinoma of skin 04/10/2019   Well differentiated SCC with superficial infiltration. Left distal anterior thigh.   Squamous cell carcinoma of skin 07/17/2019   Left dorsum hand. WD SCC, deep margin involved.    Surgical History: Past Surgical History:  Procedure Laterality Date   ANKLE SURGERY     APPENDECTOMY     EXTRACORPOREAL SHOCK WAVE LITHOTRIPSY Right 05/25/2016   Procedure: EXTRACORPOREAL SHOCK WAVE LITHOTRIPSY (ESWL);  Surgeon: Hollice Espy, MD;  Location: [facility] ORS;  Service: Urology;  Laterality: Right;   EXTRACORPOREAL SHOCK WAVE LITHOTRIPSY Left 10/11/2017   Procedure: EXTRACORPOREAL SHOCK WAVE LITHOTRIPSY (ESWL);  Surgeon: Hollice Espy, MD;  Location: [facility] ORS;  Service: Urology;  Laterality: Left;   JOINT REPLACEMENT Left 2016   knee   TONSILLECTOMY     TUBAL LIGATION      Home Medications:  Allergies as of 01/12/2021       Reactions   Diazepam Other (See Comments)  Stops breathing (oversedation)        Medication List        Accurate as of January 12, 2021  3:09 PM. If you have any questions, ask your nurse or doctor.          meclizine 12.5 MG tablet Commonly known as: ANTIVERT Take 12.5 mg by mouth 3 (three) times daily as needed for dizziness.   mupirocin ointment 2 % Commonly known as: BACTROBAN Apply 1 application topically daily. With dressing changes What changed: Another medication with the  same name was removed. Continue taking this medication, and follow the directions you see here. Changed by: Hollice Espy, MD        Allergies:  Allergies  Allergen Reactions   Diazepam Other (See Comments)    Stops breathing (oversedation)    Family History: Family History  Problem Relation Age of Onset   Prostate cancer Brother    Kidney cancer Neg Hx    Bladder Cancer Neg Hx    Breast cancer Neg Hx     Social History:  reports that she has never smoked. She has never used smokeless tobacco. She reports that she does not drink alcohol and does not use drugs.   Physical Exam: BP (!) 154/75   Pulse (!) 59   Ht 5\' 5"  (1.651 m)   Wt 130 lb (59 kg)   BMI 21.63 kg/m   Constitutional:  Alert and oriented, No acute distress. HEENT: [place] AT, moist mucus membranes.  Trachea midline, no masses. Cardiovascular: No clubbing, cyanosis, or edema. Respiratory: Normal respiratory effort, no increased work of breathing. Skin: No rashes, bruises or suspicious lesions. Neurologic: Grossly intact, no focal deficits, moving all 4 extremities. Psychiatric: Normal mood and affect.  Laboratory Data:  Lab Results  Component Value Date   CREATININE 0.68 03/28/2017   Urinalysis - 6-10 wbcs, 11-30 rbcs, nitrite negative, few bacteria   Pertinent Imaging: KUB was personally reviewed.    Assessment & Plan:    Right proximal UPJ stone  - 10 mm in size - Discussed with her today that the stone is too large for interval passage. She has done well with ESWL before and would like to proceed with this. She will return tomorrow for ESWL.  -Alternatives including ureteroscopy were also discussed.  She prefers to the above. - Will further discuss treatment of nonobstructing stones after ESWL  - Urine sent for pre-operative culture   Return tomorrow (01/13/2021) for ESWL   I,Kailey Littlejohn,acting as a scribe for Hollice Espy, MD.,have documented all relevant documentation on the behalf  of Hollice Espy, MD,as directed by  Hollice Espy, MD while in the presence of Hollice Espy, MD.  I have reviewed the above documentation for accuracy and completeness, and I agree with the above.   Hollice Espy, MD  [facility] [address], [place], [place] [address] [phone]

## 2021-01-12 ENCOUNTER — Other Ambulatory Visit: Payer: Self-pay

## 2021-01-12 ENCOUNTER — Ambulatory Visit
Admission: RE | Admit: 2021-01-12 | Discharge: 2021-01-12 | Disposition: A | Discharge status: Home / Self Care | Payer: Medicare Other | Attending: Urology | Admitting: Urology

## 2021-01-12 ENCOUNTER — Other Ambulatory Visit: Payer: Self-pay | Admitting: Urology

## 2021-01-12 ENCOUNTER — Encounter: Payer: Self-pay | Admitting: Urology

## 2021-01-12 ENCOUNTER — Ambulatory Visit (INDEPENDENT_AMBULATORY_CARE_PROVIDER_SITE_OTHER): Payer: Medicare Other | Admitting: Urology

## 2021-01-12 ENCOUNTER — Ambulatory Visit
Admission: RE | Admit: 2021-01-12 | Discharge: 2021-01-12 | Disposition: A | Discharge status: Home / Self Care | Payer: Medicare Other | Source: Ambulatory Visit | Attending: Urology | Admitting: Urology

## 2021-01-12 VITALS — BP 154/75 | HR 59 | Ht 65.0 in | Wt 130.0 lb

## 2021-01-12 DIAGNOSIS — R109 Unspecified abdominal pain: Secondary | ICD-10-CM | POA: Diagnosis present

## 2021-01-12 DIAGNOSIS — R319 Hematuria, unspecified: Secondary | ICD-10-CM | POA: Diagnosis not present

## 2021-01-12 DIAGNOSIS — N201 Calculus of ureter: Secondary | ICD-10-CM

## 2021-01-12 NOTE — Progress Notes (Signed)
ESWL ORDER FORM  Expected date of procedure: 01/13/21  Surgeon: Nickolas Madrid, MD  Post op standing: 2-4wk follow up w/KUB prior  Anticoagulation/Aspirin/NSAID standing order: Hold all 72 hours prior  Anesthesia standing order: MAC  VTE standing: SCD's  Dx: Right Ureteral Stone  Procedure: right Extracorporeal shock wave lithotripsy  CPT : 49449  Standing Order Set:   *NPO after mn (NO KUB NEEDED)  *NS 133m/hr, Keflex 5043mPO, Benadryl 2557mO, Valium 44m40m, Zofran 4mg 79m   Medications if other than standing orders:   NONE

## 2021-01-13 ENCOUNTER — Ambulatory Visit
Admission: RE | Admit: 2021-01-13 | Discharge: 2021-01-13 | Disposition: A | Discharge status: Home / Self Care | Payer: Medicare Other | Attending: Urology | Admitting: Urology

## 2021-01-13 ENCOUNTER — Encounter: Payer: Self-pay | Admitting: Urology

## 2021-01-13 ENCOUNTER — Encounter
Admission: RE | Disposition: A | Discharge status: Home / Self Care | Payer: Self-pay | Source: Home / Self Care | Attending: Urology

## 2021-01-13 DIAGNOSIS — N201 Calculus of ureter: Secondary | ICD-10-CM | POA: Diagnosis present

## 2021-01-13 HISTORY — PX: EXTRACORPOREAL SHOCK WAVE LITHOTRIPSY: SHX1557

## 2021-01-13 LAB — MICROSCOPIC EXAMINATION

## 2021-01-13 LAB — URINALYSIS, COMPLETE
Bilirubin, UA: NEGATIVE
Glucose, UA: NEGATIVE
Ketones, UA: NEGATIVE
Nitrite, UA: NEGATIVE
Specific Gravity, UA: 1.025 (ref 1.005–1.030)
Urobilinogen, Ur: 0.2 mg/dL (ref 0.2–1.0)
pH, UA: 5.5 (ref 5.0–7.5)

## 2021-01-13 SURGERY — LITHOTRIPSY, ESWL
Anesthesia: Moderate Sedation | Laterality: Right

## 2021-01-13 MED ORDER — DIAZEPAM 5 MG PO TABS
ORAL_TABLET | ORAL | Status: AC
  Filled 2021-01-13: qty 2

## 2021-01-13 MED ORDER — DIPHENHYDRAMINE HCL 25 MG PO CAPS
ORAL_CAPSULE | ORAL | Status: AC
  Administered 2021-01-13: 25 mg via ORAL
  Filled 2021-01-13: qty 1

## 2021-01-13 MED ORDER — TAMSULOSIN HCL 0.4 MG PO CAPS: 0.4000 mg | ORAL_CAPSULE | Freq: Every day | ORAL | 0 refills | Status: DC

## 2021-01-13 MED ORDER — ONDANSETRON HCL 4 MG/2ML IJ SOLN
INTRAMUSCULAR | Status: AC
  Administered 2021-01-13: 4 mg via INTRAVENOUS
  Filled 2021-01-13: qty 2

## 2021-01-13 MED ORDER — DIPHENHYDRAMINE HCL 25 MG PO CAPS: 25.0000 mg | ORAL_CAPSULE | ORAL | Status: AC

## 2021-01-13 MED ORDER — DIAZEPAM 5 MG PO TABS: 10.0000 mg | ORAL_TABLET | ORAL | Status: DC

## 2021-01-13 MED ORDER — ONDANSETRON HCL 4 MG/2ML IJ SOLN
4.0000 mg | Freq: Once | INTRAMUSCULAR | Status: AC
Start: 2021-01-13 — End: 2021-01-13

## 2021-01-13 MED ORDER — SODIUM CHLORIDE 0.9 % IV SOLN: INTRAVENOUS | Status: DC

## 2021-01-13 MED ORDER — CEPHALEXIN 500 MG PO CAPS
500.0000 mg | ORAL_CAPSULE | Freq: Once | ORAL | Status: AC
Start: 2021-01-13 — End: 2021-01-13

## 2021-01-13 MED ORDER — CEPHALEXIN 500 MG PO CAPS
ORAL_CAPSULE | ORAL | Status: AC
  Administered 2021-01-13: 500 mg via ORAL
  Filled 2021-01-13: qty 1

## 2021-01-13 NOTE — Interval H&P Note (Signed)
UROLOGY H&P UPDATE  Agree with prior H&P dated 12/7 by Dr Erlene Quan  Cardiac: RRR Lungs: CTA bilaterally  Laterality: RIGHT Procedure: RIGHT shockwave lithotripsy   Informed consent obtained, we specifically discussed the risks of bleeding, infection, post-operative pain, obstructive fragments/steinstrasse, need for additional procedures.  Billey Co, MD 01/13/2021

## 2021-01-13 NOTE — Discharge Instructions (Signed)

## 2021-01-13 NOTE — Brief Op Note (Signed)
01/13/2021  12:28 PM  PATIENT:  Tiffany Cole  87 y.o. female  PRE-OPERATIVE DIAGNOSIS:  Right 1cm UPJ stone  POST-OPERATIVE DIAGNOSIS:  Same  PROCEDURE:  Procedure(s): EXTRACORPOREAL SHOCK WAVE LITHOTRIPSY (ESWL) (Right)  SURGEON:  Surgeon(s) and Role:    Billey Co, MD - Primary  ANESTHESIA: Conscious Sedation  EBL:  None  Drains: None  Specimen: None  Findings:  Stone appeared to smudge significantly, tolerated SWL well  DISPO: Flomax, pain meds PRN, RTC 2 weeks KUB  Nickolas Madrid, MD 01/13/2021

## 2021-01-17 ENCOUNTER — Other Ambulatory Visit: Payer: Self-pay

## 2021-01-17 DIAGNOSIS — N201 Calculus of ureter: Secondary | ICD-10-CM

## 2021-02-03 ENCOUNTER — Ambulatory Visit
Admission: RE | Admit: 2021-02-03 | Discharge: 2021-02-03 | Disposition: A | Discharge status: Home / Self Care | Payer: Medicare Other | Attending: Urology | Admitting: Urology

## 2021-02-03 ENCOUNTER — Encounter: Payer: Self-pay | Admitting: Physician Assistant

## 2021-02-03 ENCOUNTER — Other Ambulatory Visit: Payer: Self-pay

## 2021-02-03 ENCOUNTER — Ambulatory Visit
Admission: RE | Admit: 2021-02-03 | Discharge: 2021-02-03 | Disposition: A | Discharge status: Home / Self Care | Payer: Medicare Other | Source: Ambulatory Visit | Attending: Urology | Admitting: Urology

## 2021-02-03 ENCOUNTER — Ambulatory Visit (INDEPENDENT_AMBULATORY_CARE_PROVIDER_SITE_OTHER): Payer: Medicare Other | Admitting: Physician Assistant

## 2021-02-03 VITALS — BP 165/67 | HR 67 | Ht 65.0 in | Wt 123.5 lb

## 2021-02-03 DIAGNOSIS — N201 Calculus of ureter: Secondary | ICD-10-CM

## 2021-02-03 LAB — URINALYSIS, COMPLETE
Bilirubin, UA: NEGATIVE
Glucose, UA: NEGATIVE
Ketones, UA: NEGATIVE
Leukocytes,UA: NEGATIVE
Nitrite, UA: NEGATIVE
Protein,UA: NEGATIVE
Specific Gravity, UA: 1.025 (ref 1.005–1.030)
Urobilinogen, Ur: 0.2 mg/dL (ref 0.2–1.0)
pH, UA: 5.5 (ref 5.0–7.5)

## 2021-02-03 LAB — MICROSCOPIC EXAMINATION: Bacteria, UA: NONE SEEN

## 2021-02-03 NOTE — Progress Notes (Signed)
02/03/2021 2:54 PM   Tiffany Cole 1928-03-07 [id number]  CC: Chief Complaint  Patient presents with   Nephrolithiasis   HPI: Tiffany Cole is a 87 y.o. female with PMH nephrolithiasis who underwent ESWL with Dr. Diamantina Providence on 01/13/2021 for management of a 10 mm right UPJ stone who presents today for postop follow-up.  Stone appeared to smudge during the procedure.  Today she reports she passed a small volume of debris and small fragments, however she did not capture these.  She does not think that she passed a large volume of material.  That said, she has had complete resolution of flank pain and gross hematuria.  KUB today with apparent interval resolution of the right UPJ stone.  In-office UA today positive for 2+ blood; urine microscopy with 3-10 RBCs/HPF.  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 Left pretibial   Squamous cell carcinoma of left lower leg 09/07/2014   Well differentiated SCC with superficial infiltration.  Left pretibial   Squamous cell carcinoma of left lower leg 01/01/2017   Well differentiated SCC with superficial infiltration.  Left pretibial mid.   Squamous cell carcinoma of right lower leg 01/07/2008   Well differentiated SCC. Right lower leg   Squamous cell carcinoma of right lower leg 07/23/2012   Well differentiated SCC with superficial infiltration. Right pretibial   Squamous cell carcinoma of right lower leg 02/27/2019   SCCis, hypertrophic. Right distal lateral pretibial.   Squamous cell carcinoma of right lower leg 02/27/2019   Well differentiated SCC. Right distal medial  pretibial.   Squamous cell carcinoma of skin 10/01/2006   Well differentiated SCC with superficial infiltration. Inferior lateral pretibial.   Squamous cell carcinoma of skin 04/10/2019   Well differentiated SCC with superficial infiltration. Left distal anterior thigh.   Squamous cell carcinoma of skin 07/17/2019   Left dorsum hand. WD SCC, deep margin involved.    Surgical History: Past Surgical History:  Procedure Laterality Date   ANKLE SURGERY     APPENDECTOMY     EXTRACORPOREAL SHOCK WAVE LITHOTRIPSY Right 05/25/2016   Procedure: EXTRACORPOREAL SHOCK WAVE LITHOTRIPSY (ESWL);  Surgeon: Hollice Espy, MD;  Location: [facility] ORS;  Service: Urology;  Laterality: Right;   EXTRACORPOREAL SHOCK WAVE LITHOTRIPSY Left 10/11/2017   Procedure: EXTRACORPOREAL SHOCK WAVE LITHOTRIPSY (ESWL);  Surgeon: Hollice Espy, MD;  Location: [facility] ORS;  Service: Urology;  Laterality: Left;   EXTRACORPOREAL SHOCK WAVE LITHOTRIPSY Right 01/13/2021   Procedure: EXTRACORPOREAL SHOCK WAVE LITHOTRIPSY (ESWL);  Surgeon: Billey Co, MD;  Location: [facility] ORS;  Service: Urology;  Laterality: Right;   JOINT REPLACEMENT Left 2016   knee   TONSILLECTOMY     TUBAL LIGATION      Home Medications:  Allergies as of 02/03/2021       Reactions   Diazepam Other (See Comments)   Stops breathing (oversedation)        Medication List        Accurate as of February 03, 2021  2:54 PM. If you have any questions, ask your nurse or doctor.  STOP taking these medications    mupirocin ointment 2 % Commonly known as: BACTROBAN Stopped by: Debroah Loop, PA-C       TAKE these medications    meclizine 12.5 MG tablet Commonly known as: ANTIVERT Take 12.5 mg by mouth 3 (three) times daily as needed for dizziness.   tamsulosin 0.4 MG Caps capsule Commonly known as: FLOMAX Take 1 capsule (0.4 mg total) by mouth daily after supper.        Allergies:  Allergies  Allergen Reactions    Diazepam Other (See Comments)    Stops breathing (oversedation)    Family History: Family History  Problem Relation Age of Onset   Prostate cancer Brother    Kidney cancer Neg Hx    Bladder Cancer Neg Hx    Breast cancer Neg Hx     Social History:   reports that she has never smoked. She has never used smokeless tobacco. She reports that she does not drink alcohol and does not use drugs.  Physical Exam: BP (!) 165/67 (BP Location: Left Arm, Patient Position: Sitting, Cuff Size: Normal)    Pulse 67    Ht 5\' 5"  (1.651 m)    Wt 123 lb 8 oz (56 kg)    BMI 20.55 kg/m   Constitutional:  Alert and oriented, no acute distress, nontoxic appearing HEENT: [place], AT Cardiovascular: No clubbing, cyanosis, or edema Respiratory: Normal respiratory effort, no increased work of breathing Skin: No rashes, bruises or suspicious lesions Neurologic: Grossly intact, no focal deficits, moving all 4 extremities Psychiatric: Normal mood and affect  Laboratory Data: Results for orders placed or performed in visit on 02/03/21  Microscopic Examination   Urine  Result Value Ref Range   WBC, UA 0-5 0 - 5 /hpf   RBC 3-10 (A) 0 - 2 /hpf   Epithelial Cells (non renal) 0-10 0 - 10 /hpf   Bacteria, UA None seen None seen/Few  Urinalysis, Complete  Result Value Ref Range   Specific Gravity, UA 1.025 1.005 - 1.030   pH, UA 5.5 5.0 - 7.5   Color, UA Yellow Yellow   Appearance Ur Clear Clear   Leukocytes,UA Negative Negative   Protein,UA Negative Negative/Trace   Glucose, UA Negative Negative   Ketones, UA Negative Negative   RBC, UA 2+ (A) Negative   Bilirubin, UA Negative Negative   Urobilinogen, Ur 0.2 0.2 - 1.0 mg/dL   Nitrite, UA Negative Negative   Microscopic Examination See below:    Pertinent Imaging: KUB, 02/03/2021: CLINICAL DATA:  Right ureteral stone status post ESWL.   EXAM: ABDOMEN - 1 VIEW   COMPARISON:  Radiographs 01/12/2021   FINDINGS: Multiple bilateral intrarenal calculi.  Largest stone or 2 adjacent stones in the right measures 15 mm. Largest stone on the left measures 5 mm. A few calcifications in the right pelvis are unchanged from prior exam in typical of phleboliths. No evident ureteral stone by radiograph. Normal bowel gas pattern with moderate stool burden. Degenerative change in the spine. Right hip arthroplasty. Advanced left hip osteoarthritis.   IMPRESSION: Multiple bilateral intrarenal calculi. No evident ureteral stone by radiograph.     Electronically Signed   By: Keith Rake M.D.   On: 02/04/2021 12:35  I personally reviewed the images referenced above and note interval clearance of the right UPJ stone.  Assessment & Plan:   1. Right ureteral stone Stone resolved on KUB and she is now asymptomatic, though she does have a small amount of microscopic hematuria  today.  I offered her repeat UA in 2 weeks to prove resolution of this, otherwise we discussed that this could be an indicator of retained fragment.  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  She understands to return to clinic with recurrent flank pain or gross hematuria.  Return if symptoms worsen or fail to improve.  Debroah Loop, PA-C  [facility] [address] [place], [place] [address] [phone]

## 2021-02-10 ENCOUNTER — Other Ambulatory Visit: Payer: Self-pay | Admitting: Urology

## 2021-06-06 ENCOUNTER — Encounter: Payer: Self-pay | Admitting: Dermatology

## 2021-06-06 ENCOUNTER — Ambulatory Visit (INDEPENDENT_AMBULATORY_CARE_PROVIDER_SITE_OTHER): Payer: Medicare Other | Admitting: Dermatology

## 2021-06-06 DIAGNOSIS — L578 Other skin changes due to chronic exposure to nonionizing radiation: Secondary | ICD-10-CM | POA: Diagnosis not present

## 2021-06-06 DIAGNOSIS — D492 Neoplasm of unspecified behavior of bone, soft tissue, and skin: Secondary | ICD-10-CM

## 2021-06-06 DIAGNOSIS — C44722 Squamous cell carcinoma of skin of right lower limb, including hip: Secondary | ICD-10-CM | POA: Diagnosis not present

## 2021-06-06 NOTE — Progress Notes (Signed)
? ?  Follow-Up Visit ?  ?Subjective  ?Tiffany Cole is a 88 y.o. female who presents for the following: lesion (Right anterior leg. Dur: 2-3 weeks. Sore at times, when pressed. Raised, red around area). ?The patient has spots, moles and lesions to be evaluated, some may be new or changing and the patient has concerns that these could be cancer. ? ?The following portions of the chart were reviewed this encounter and updated as appropriate:  Tobacco  Allergies  Meds  Problems  Med Hx  Surg Hx  Fam Hx   ?  ?Review of Systems: No other skin or systemic complaints except as noted in HPI or Assessment and Plan. ? ?Objective  ?Well appearing patient in no apparent distress; mood and affect are within normal limits. ? ?A focused examination was performed including face, right lower leg. Relevant physical exam findings are noted in the Assessment and Plan. ? ?Right Anterior Pretibial ?1.1 cm erythematous hyperkeratotic firm nodule ? ? ? ? ? ?Assessment & Plan  ?Neoplasm of skin ?Right Anterior Pretibial ? ?Epidermal / dermal shaving ? ?Lesion diameter (cm):  1.1 ?Informed consent: discussed and consent obtained   ?Timeout: patient name, date of birth, surgical site, and procedure verified   ?Procedure prep:  Patient was prepped and draped in usual sterile fashion ?Prep type:  Isopropyl alcohol ?Anesthesia: the lesion was anesthetized in a standard fashion   ?Anesthetic:  1% lidocaine w/ epinephrine 1-100,000 buffered w/ 8.4% NaHCO3 ?Instrument used: flexible razor blade   ?Hemostasis achieved with: pressure, aluminum chloride and electrodesiccation   ?Outcome: patient tolerated procedure well   ?Post-procedure details: sterile dressing applied and wound care instructions given   ?Dressing type: bandage and petrolatum   ? ?Destruction of lesion ?Complexity: extensive   ?Destruction method: electrodesiccation and curettage   ?Informed consent: discussed and consent obtained   ?Timeout:  patient name, date of birth,  surgical site, and procedure verified ?Procedure prep:  Patient was prepped and draped in usual sterile fashion ?Prep type:  Isopropyl alcohol ?Anesthesia: the lesion was anesthetized in a standard fashion   ?Anesthetic:  1% lidocaine w/ epinephrine 1-100,000 buffered w/ 8.4% NaHCO3 ?Curettage performed in three different directions: Yes   ?Electrodesiccation performed over the curetted area: Yes   ?Curettage cycles:  3 ?Lesion length (cm):  1.1 ?Lesion width (cm):  1.1 ?Margin per side (cm):  0.2 ?Final wound size (cm):  1.5 ?Hemostasis achieved with:  pressure and aluminum chloride ?Outcome: patient tolerated procedure well with no complications   ?Post-procedure details: sterile dressing applied and wound care instructions given   ?Dressing type: bandage and petrolatum   ? ?Specimen 1 - Surgical pathology ?Differential Diagnosis: SCC ? ?Check Margins: No ? ?Actinic Damage ?- chronic, secondary to cumulative UV radiation exposure/sun exposure over time ?- diffuse scaly erythematous macules with underlying dyspigmentation ?- Recommend daily broad spectrum sunscreen SPF 30+ to sun-exposed areas, reapply every 2 hours as needed.  ?- Recommend staying in the shade or wearing long sleeves, sun glasses (UVA+UVB protection) and wide brim hats (4-inch brim around the entire circumference of the hat). ?- Call for new or changing lesions. ? ?Return for TBSE As Scheduled. ? ?I, Emelia Salisbury, CMA, am acting as scribe for [name], MD. ?Documentation: I have reviewed the above documentation for accuracy and completeness, and I agree with the above. ? ?[name], MD ? ?

## 2021-06-06 NOTE — Patient Instructions (Addendum)
Recommend daily broad spectrum sunscreen SPF 30+ to sun-exposed areas, reapply every 2 hours as needed. Call for new or changing lesions.  ?Staying in the shade or wearing long sleeves, sun glasses (UVA+UVB protection) and wide brim hats (4-inch brim around the entire circumference of the hat) are also recommended for sun protection.  ? ? ?Wound Care Instructions ? ?Cleanse wound gently with soap and water once a day then pat dry with clean gauze. Apply a thing coat of Petrolatum (petroleum jelly, "Vaseline") over the wound (unless you have an allergy to this). We recommend that you use a new, sterile tube of Vaseline. Do not pick or remove scabs. Do not remove the yellow or white "healing tissue" from the base of the wound. ? ?Cover the wound with fresh, clean, nonstick gauze and secure with paper tape. You may use Band-Aids in place of gauze and tape if the would is small enough, but would recommend trimming much of the tape off as there is often too much. Sometimes Band-Aids can irritate the skin. ? ?You should call the office for your biopsy report after 1 week if you have not already been contacted. ? ?If you experience any problems, such as abnormal amounts of bleeding, swelling, significant bruising, significant pain, or evidence of infection, please call the office immediately. ? ?FOR ADULT SURGERY PATIENTS: If you need something for pain relief you may take 1 extra strength Tylenol (acetaminophen) AND 2 Ibuprofen ('200mg'$  each) together every 4 hours as needed for pain. (do not take these if you are allergic to them or if you have a reason you should not take them.) Typically, you may only need pain medication for 1 to 3 days.  ? ? ? ?If You Need Anything After Your Visit ? ?If you have any questions or concerns for your doctor, please call our main line at [phone] and press option 4 to reach your doctor's medical assistant. If no one answers, please leave a voicemail as directed and we will return your  call as soon as possible. Messages left after 4 pm will be answered the following business day.  ? ?You may also send [place] a message via MyChart. We typically respond to MyChart messages within 1-2 business days. ? ?For prescription refills, please ask your pharmacy to contact our office. Our fax number is [phone]. ? ?If you have an urgent issue when the clinic is closed that cannot wait until the next business day, you can page your doctor at the number below.   ? ?Please note that while we do our best to be available for urgent issues outside of office hours, we are not available 24/7.  ? ?If you have an urgent issue and are unable to reach [place], you may choose to seek medical care at your doctor's office, retail clinic, urgent care center, or emergency room. ? ?If you have a medical emergency, please immediately call 911 or go to the emergency department. ? ?Pager Numbers ? ?- Dr. Nehemiah Massed: [phone] ? ?- Dr. Laurence Ferrari: [phone] ? ?- Dr. Nicole Kindred: [phone] ? ?In the event of inclement weather, please call our main line at [phone] for an update on the status of any delays or closures. ? ?Dermatology Medication Tips: ?Please keep the boxes that topical medications come in in order to help keep track of the instructions about where and how to use these. Pharmacies typically print the medication instructions only on the boxes and not directly on the medication tubes.  ? ?If your medication  is too expensive, please contact our office at [phone] option 4 or send [place] a message through [place].  ? ?We are unable to tell what your co-pay for medications will be in advance as this is different depending on your insurance coverage. However, we may be able to find a substitute medication at lower cost or fill out paperwork to get insurance to cover a needed medication.  ? ?If a prior authorization is required to get your medication covered by your insurance company, please allow [place] 1-2 business days to  complete this process. ? ?Drug prices often vary depending on where the prescription is filled and some pharmacies may offer cheaper prices. ? ?The website www.goodrx.com contains coupons for medications through different pharmacies. The prices here do not account for what the cost may be with help from insurance (it may be cheaper with your insurance), but the website can give you the price if you did not use any insurance.  ?- You can print the associated coupon and take it with your prescription to the pharmacy.  ?- You may also stop by our office during regular business hours and pick up a GoodRx coupon card.  ?- If you need your prescription sent electronically to a different pharmacy, notify our office through [facility] or by phone at [phone] option 4. ? ? ? ? ?Si Usted Necesita Algo Despu?s de Su Visita ? ?Tambi?n puede enviarnos un mensaje a trav?s de MyChart. Por lo general respondemos a los mensajes de MyChart en el transcurso de 1 a 2 d?as h?biles. ? ?Para renovar recetas, por favor pida a su farmacia que se ponga en contacto con nuestra oficina. Nuestro n?mero de fax es el [phone]. ? ?Si tiene un asunto urgente cuando la cl?nica est? cerrada y que no puede esperar hasta el siguiente d?a h?bil, puede llamar/localizar a su doctor(a) al n?mero que aparece a continuaci?n.  ? ?Por favor, tenga en cuenta que aunque hacemos todo lo posible para estar disponibles para asuntos urgentes fuera del horario de oficina, no estamos disponibles las 24 horas del d?a, los 7 d?as de la semana.  ? ?Si tiene un problema urgente y no puede comunicarse con nosotros, puede optar por buscar atenci?n m?dica  en el consultorio de su doctor(a), en una cl?nica privada, en un centro de atenci?n urgente o en una sala de emergencias. ? ?Si tiene Engineer, maintenance (IT) m?dica, por favor llame inmediatamente al 911 o vaya a la sala de emergencias. ? ?N?meros de b?per ? ?- Dr. Nehemiah Massed: [phone] ? ?- Dra. Moye:  [phone] ? ?- Dra. Nicole Kindred: [phone] ? ?En caso de inclemencias del tiempo, por favor llame a nuestra l?nea principal al [phone] para una actualizaci?n sobre el estado de cualquier retraso o cierre. ? ?Consejos para la medicaci?n en dermatolog?a: ?Por favor, guarde las cajas en las que vienen los medicamentos de uso t?pico para ayudarle a seguir las instrucciones sobre d?nde y c?mo usarlos. Las farmacias generalmente imprimen las instrucciones del medicamento s?lo en las cajas y no directamente en los tubos del [place].  ? ?Si su medicamento es muy caro, por favor, p?ngase en contacto con Zigmund Daniel llamando al [phone] y presione la opci?n 4 o env?enos un mensaje a trav?s de MyChart.  ? ?No podemos decirle cu?l ser? su copago por los medicamentos por adelantado ya que esto es diferente dependiendo de la cobertura de su seguro. Sin embargo, es posible que podamos encontrar un medicamento sustituto a Electrical engineer un formulario para que  el seguro cubra el medicamento que se considera necesario.  ? ?Si se requiere Ardelia Mems autorizaci?n previa para que su compa??a de seguros [place] su medicamento, por favor perm?tanos de 1 a 2 d?as h?biles para completar este proceso. ? ?Los precios de los medicamentos var?an con frecuencia dependiendo del Environmental consultant de d?nde se surte la receta y alguna farmacias pueden ofrecer precios m?s baratos. ? ?El sitio web www.goodrx.com tiene cupones para medicamentos de Airline pilot. Los precios aqu? no tienen en cuenta lo que podr?a costar con la ayuda del seguro (puede ser m?s barato con su seguro), pero el sitio web puede darle el precio si no utiliz? ning?n seguro.  ?- Puede imprimir el cup?n correspondiente y llevarlo con su receta a la farmacia.  ?- Tambi?n puede pasar por nuestra oficina durante el horario de atenci?n regular y recoger una tarjeta de cupones de GoodRx.  ?- Si necesita que su receta se env?e electr?nicamente a Chiropodist,  informe a nuestra oficina a trav?s de MyChart de [place] o por tel?fono llamando al [phone] y presione la opci?n 4.  ? ?

## 2021-06-08 ENCOUNTER — Telehealth: Payer: Self-pay

## 2021-06-08 NOTE — Telephone Encounter (Signed)
-----   Message from Ralene Bathe, MD sent at 06/07/2021  8:54 PM EDT ----- ?Diagnosis ?Skin , right anterior pretibial ?WELL DIFFERENTIATED SQUAMOUS CELL CARCINOMA, BASE INVOLVED ? ?Cancer-SCC ?Already treated ?Recheck next visit ?

## 2021-06-08 NOTE — Telephone Encounter (Signed)
Left pt msg to call for bx result/sh °

## 2021-06-09 ENCOUNTER — Telehealth: Payer: Self-pay

## 2021-06-09 NOTE — Telephone Encounter (Signed)
Discussed biopsy results with pt  °

## 2021-06-14 ENCOUNTER — Encounter: Payer: Self-pay | Admitting: Dermatology

## 2021-11-10 ENCOUNTER — Ambulatory Visit (INDEPENDENT_AMBULATORY_CARE_PROVIDER_SITE_OTHER): Payer: Medicare Other | Admitting: Dermatology

## 2021-11-10 ENCOUNTER — Encounter: Payer: Self-pay | Admitting: Dermatology

## 2021-11-10 DIAGNOSIS — L814 Other melanin hyperpigmentation: Secondary | ICD-10-CM | POA: Diagnosis not present

## 2021-11-10 DIAGNOSIS — L578 Other skin changes due to chronic exposure to nonionizing radiation: Secondary | ICD-10-CM | POA: Diagnosis not present

## 2021-11-10 DIAGNOSIS — Z1283 Encounter for screening for malignant neoplasm of skin: Secondary | ICD-10-CM | POA: Diagnosis not present

## 2021-11-10 DIAGNOSIS — L570 Actinic keratosis: Secondary | ICD-10-CM

## 2021-11-10 DIAGNOSIS — L821 Other seborrheic keratosis: Secondary | ICD-10-CM

## 2021-11-10 DIAGNOSIS — C4492 Squamous cell carcinoma of skin, unspecified: Secondary | ICD-10-CM

## 2021-11-10 DIAGNOSIS — D229 Melanocytic nevi, unspecified: Secondary | ICD-10-CM

## 2021-11-10 DIAGNOSIS — D1801 Hemangioma of skin and subcutaneous tissue: Secondary | ICD-10-CM

## 2021-11-10 DIAGNOSIS — L820 Inflamed seborrheic keratosis: Secondary | ICD-10-CM

## 2021-11-10 DIAGNOSIS — Z85828 Personal history of other malignant neoplasm of skin: Secondary | ICD-10-CM

## 2021-11-10 NOTE — Progress Notes (Signed)
Follow-Up Visit   Subjective  Tiffany Cole is a 88 y.o. female who presents for the following: Annual Exam (Hx of SCC's, Hx of AKs). The patient presents for Total-Body Skin Exam (TBSE) for skin cancer screening and mole check.  The patient has spots, moles and lesions to be evaluated, some may be new or changing and the patient has concerns that these could be cancer.  The following portions of the chart were reviewed this encounter and updated as appropriate:  Tobacco  Allergies  Meds  Problems  Med Hx  Surg Hx  Fam Hx     Review of Systems: No other skin or systemic complaints except as noted in HPI or Assessment and Plan.  Objective  Well appearing patient in no apparent distress; mood and affect are within normal limits.  A full examination was performed including scalp, head, eyes, ears, nose, lips, neck, chest, axillae, abdomen, back, buttocks, bilateral upper extremities, bilateral lower extremities, hands, feet, fingers, toes, fingernails, and toenails. All findings within normal limits unless otherwise noted below.  B/L thighs x17, left knee x1, right pretibial x1 (19) Erythematous keratotic or waxy stuck-on papule or plaque.  Left Malar Cheek x1 Erythematous thin papules/macules with gritty scale.    Assessment & Plan  Inflamed seborrheic keratosis (19) B/L thighs x17, left knee x1, right pretibial x1  Symptomatic, irritating, patient would like treated.  Recheck right pretibial at next visit.   Destruction of lesion - B/L thighs x17, left knee x1, right pretibial x1 Complexity: simple   Destruction method: cryotherapy   Informed consent: discussed and consent obtained   Timeout:  patient name, date of birth, surgical site, and procedure verified Lesion destroyed using liquid nitrogen: Yes   Region frozen until ice ball extended beyond lesion: Yes   Outcome: patient tolerated procedure well with no complications   Post-procedure details: wound care  instructions given   Additional details:  Prior to procedure, discussed risks of blister formation, small wound, skin dyspigmentation, or rare scar following cryotherapy. Recommend Vaseline ointment to treated areas while healing.   AK (actinic keratosis) Left Malar Cheek x1  Actinic keratoses are precancerous spots that appear secondary to cumulative UV radiation exposure/sun exposure over time. They are chronic with expected duration over 1 year. A portion of actinic keratoses will progress to squamous cell carcinoma of the skin. It is not possible to reliably predict which spots will progress to skin cancer and so treatment is recommended to prevent development of skin cancer.  Recommend daily broad spectrum sunscreen SPF 30+ to sun-exposed areas, reapply every 2 hours as needed.  Recommend staying in the shade or wearing long sleeves, sun glasses (UVA+UVB protection) and wide brim hats (4-inch brim around the entire circumference of the hat). Call for new or changing lesions.  Destruction of lesion - Left Malar Cheek x1 Complexity: simple   Destruction method: cryotherapy   Informed consent: discussed and consent obtained   Timeout:  patient name, date of birth, surgical site, and procedure verified Lesion destroyed using liquid nitrogen: Yes   Region frozen until ice ball extended beyond lesion: Yes   Outcome: patient tolerated procedure well with no complications   Post-procedure details: wound care instructions given   Additional details:  Prior to procedure, discussed risks of blister formation, small wound, skin dyspigmentation, or rare scar following cryotherapy. Recommend Vaseline ointment to treated areas while healing.   History of Squamous Cell Carcinoma of the Skin - No evidence of recurrence today -  No lymphadenopathy - Recommend regular full body skin exams - Recommend daily broad spectrum sunscreen SPF 30+ to sun-exposed areas, reapply every 2 hours as needed.  - Call if  any new or changing lesions are noted between office visits  Lentigines - Scattered tan macules - Due to sun exposure - Benign-appearing, observe - Recommend daily broad spectrum sunscreen SPF 30+ to sun-exposed areas, reapply every 2 hours as needed. - Call for any changes  Seborrheic Keratoses - Stuck-on, waxy, tan-brown papules and/or plaques  - Benign-appearing - Discussed benign etiology and prognosis. - Observe - Call for any changes  Melanocytic Nevi - Tan-brown and/or pink-flesh-colored symmetric macules and papules - Benign appearing on exam today - Observation - Call clinic for new or changing moles - Recommend daily use of broad spectrum spf 30+ sunscreen to sun-exposed areas.   Hemangiomas - Red papules - Discussed benign nature - Observe - Call for any changes  Actinic Damage - Chronic condition, secondary to cumulative UV/sun exposure - diffuse scaly erythematous macules with underlying dyspigmentation - Recommend daily broad spectrum sunscreen SPF 30+ to sun-exposed areas, reapply every 2 hours as needed.  - Staying in the shade or wearing long sleeves, sun glasses (UVA+UVB protection) and wide brim hats (4-inch brim around the entire circumference of the hat) are also recommended for sun protection.  - Call for new or changing lesions.  Skin cancer screening performed today.  Return in about 6 months (around 05/12/2022) for Hx of SCC's, AK Follow Up, ISK Follow Up.  I, Emelia Salisbury, CMA, am acting as scribe for [name], MD. Documentation: I have reviewed the above documentation for accuracy and completeness, and I agree with the above.  [name], MD

## 2021-11-10 NOTE — Patient Instructions (Addendum)
Cryotherapy Aftercare  Wash gently with soap and water everyday.   Apply Vaseline daily until healed.    Recommend daily broad spectrum sunscreen SPF 30+ to sun-exposed areas, reapply every 2 hours as needed. Call for new or changing lesions.  Staying in the shade or wearing long sleeves, sun glasses (UVA+UVB protection) and wide brim hats (4-inch brim around the entire circumference of the hat) are also recommended for sun protection.    Melanoma ABCDEs  Melanoma is the most dangerous type of skin cancer, and is the leading cause of death from skin disease.  You are more likely to develop melanoma if you: Have light-colored skin, light-colored eyes, or red or blond hair Spend a lot of time in the sun Tan regularly, either outdoors or in a tanning bed Have had blistering sunburns, especially during childhood Have a close family member who has had a melanoma Have atypical moles or large birthmarks  Early detection of melanoma is key since treatment is typically straightforward and cure rates are extremely high if we catch it early.   The first sign of melanoma is often a change in a mole or a new dark spot.  The ABCDE system is a way of remembering the signs of melanoma.  A for asymmetry:  The two halves do not match. B for border:  The edges of the growth are irregular. C for color:  A mixture of colors are present instead of an even brown color. D for diameter:  Melanomas are usually (but not always) greater than 6mm - the size of a pencil eraser. E for evolution:  The spot keeps changing in size, shape, and color.  Please check your skin once per month between visits. You can use a small mirror in front and a large mirror behind you to keep an eye on the back side or your body.   If you see any new or changing lesions before your next follow-up, please call to schedule a visit.  Please continue daily skin protection including broad spectrum sunscreen SPF 30+ to sun-exposed areas,  reapplying every 2 hours as needed when you're outdoors.   Staying in the shade or wearing long sleeves, sun glasses (UVA+UVB protection) and wide brim hats (4-inch brim around the entire circumference of the hat) are also recommended for sun protection.       Due to recent changes in healthcare laws, you may see results of your pathology and/or laboratory studies on MyChart before the doctors have had a chance to review them. We understand that in some cases there may be results that are confusing or concerning to you. Please understand that not all results are received at the same time and often the doctors may need to interpret multiple results in order to provide you with the best plan of care or course of treatment. Therefore, we ask that you please give us 2 business days to thoroughly review all your results before contacting the office for clarification. Should we see a critical lab result, you will be contacted sooner.   If You Need Anything After Your Visit  If you have any questions or concerns for your doctor, please call our main line at [phone] and press option 4 to reach your doctor's medical assistant. If no one answers, please leave a voicemail as directed and we will return your call as soon as possible. Messages left after 4 pm will be answered the following business day.   You may also send us a message   via MyChart. We typically respond to MyChart messages within 1-2 business days.  For prescription refills, please ask your pharmacy to contact our office. Our fax number is [phone].  If you have an urgent issue when the clinic is closed that cannot wait until the next business day, you can page your doctor at the number below.    Please note that while we do our best to be available for urgent issues outside of office hours, we are not available 24/7.   If you have an urgent issue and are unable to reach us, you may choose to seek medical care at your doctor's  office, retail clinic, urgent care center, or emergency room.  If you have a medical emergency, please immediately call 911 or go to the emergency department.  Pager Numbers  - Dr. Kowalski: [phone]  - Dr. Moye: [phone]  - Dr. Stewart: [phone]  In the event of inclement weather, please call our main line at [phone] for an update on the status of any delays or closures.  Dermatology Medication Tips: Please keep the boxes that topical medications come in in order to help keep track of the instructions about where and how to use these. Pharmacies typically print the medication instructions only on the boxes and not directly on the medication tubes.   If your medication is too expensive, please contact our office at [phone] option 4 or send us a message through MyChart.   We are unable to tell what your co-pay for medications will be in advance as this is different depending on your insurance coverage. However, we may be able to find a substitute medication at lower cost or fill out paperwork to get insurance to cover a needed medication.   If a prior authorization is required to get your medication covered by your insurance company, please allow us 1-2 business days to complete this process.  Drug prices often vary depending on where the prescription is filled and some pharmacies may offer cheaper prices.  The website www.goodrx.com contains coupons for medications through different pharmacies. The prices here do not account for what the cost may be with help from insurance (it may be cheaper with your insurance), but the website can give you the price if you did not use any insurance.  - You can print the associated coupon and take it with your prescription to the pharmacy.  - You may also stop by our office during regular business hours and pick up a GoodRx coupon card.  - If you need your prescription sent electronically to a different pharmacy, notify our office  through [facility] MyChart or by phone at [phone] option 4.     Si Usted Necesita Algo Despus de Su Visita  Tambin puede enviarnos un mensaje a travs de MyChart. Por lo general respondemos a los mensajes de MyChart en el transcurso de 1 a 2 das hbiles.  Para renovar recetas, por favor pida a su farmacia que se ponga en contacto con nuestra oficina. Nuestro nmero de fax es el [phone].  Si tiene un asunto urgente cuando la clnica est cerrada y que no puede esperar hasta el siguiente da hbil, puede llamar/localizar a su doctor(a) al nmero que aparece a continuacin.   Por favor, tenga en cuenta que aunque hacemos todo lo posible para estar disponibles para asuntos urgentes fuera del horario de oficina, no estamos disponibles las 24 horas del da, los 7 das de la semana.   Si tiene un problema urgente y   no puede comunicarse con nosotros, puede optar por buscar atencin mdica  en el consultorio de su doctor(a), en una clnica privada, en un centro de atencin urgente o en una sala de emergencias.  Si tiene una emergencia mdica, por favor llame inmediatamente al 911 o vaya a la sala de emergencias.  Nmeros de bper  - Dr. Kowalski: [phone]  - Dra. Moye: [phone]  - Dra. Stewart: [phone]  En caso de inclemencias del tiempo, por favor llame a nuestra lnea principal al [phone] para una actualizacin sobre el estado de cualquier retraso o cierre.  Consejos para la medicacin en dermatologa: Por favor, guarde las cajas en las que vienen los medicamentos de uso tpico para ayudarle a seguir las instrucciones sobre dnde y cmo usarlos. Las farmacias generalmente imprimen las instrucciones del medicamento slo en las cajas y no directamente en los tubos del medicamento.   Si su medicamento es muy caro, por favor, pngase en contacto con nuestra oficina llamando al [phone] y presione la opcin 4 o envenos un mensaje a travs de MyChart.   No  podemos decirle cul ser su copago por los medicamentos por adelantado ya que esto es diferente dependiendo de la cobertura de su seguro. Sin embargo, es posible que podamos encontrar un medicamento sustituto a menor costo o llenar un formulario para que el seguro cubra el medicamento que se considera necesario.   Si se requiere una autorizacin previa para que su compaa de seguros cubra su medicamento, por favor permtanos de 1 a 2 das hbiles para completar este proceso.  Los precios de los medicamentos varan con frecuencia dependiendo del lugar de dnde se surte la receta y alguna farmacias pueden ofrecer precios ms baratos.  El sitio web www.goodrx.com tiene cupones para medicamentos de diferentes farmacias. Los precios aqu no tienen en cuenta lo que podra costar con la ayuda del seguro (puede ser ms barato con su seguro), pero el sitio web puede darle el precio si no utiliz ningn seguro.  - Puede imprimir el cupn correspondiente y llevarlo con su receta a la farmacia.  - Tambin puede pasar por nuestra oficina durante el horario de atencin regular y recoger una tarjeta de cupones de GoodRx.  - Si necesita que su receta se enve electrnicamente a una farmacia diferente, informe a nuestra oficina a travs de MyChart de [facility] o por telfono llamando al [phone] y presione la opcin 4.  

## 2021-11-15 ENCOUNTER — Encounter: Payer: Self-pay | Admitting: Dermatology

## 2022-01-26 DIAGNOSIS — I10 Essential (primary) hypertension: Secondary | ICD-10-CM | POA: Insufficient documentation

## 2022-02-09 ENCOUNTER — Ambulatory Visit (INDEPENDENT_AMBULATORY_CARE_PROVIDER_SITE_OTHER): Payer: Medicare Other | Admitting: Dermatology

## 2022-02-09 DIAGNOSIS — L821 Other seborrheic keratosis: Secondary | ICD-10-CM

## 2022-02-09 DIAGNOSIS — C44729 Squamous cell carcinoma of skin of left lower limb, including hip: Secondary | ICD-10-CM | POA: Diagnosis not present

## 2022-02-09 DIAGNOSIS — L578 Other skin changes due to chronic exposure to nonionizing radiation: Secondary | ICD-10-CM | POA: Diagnosis not present

## 2022-02-09 DIAGNOSIS — D492 Neoplasm of unspecified behavior of bone, soft tissue, and skin: Secondary | ICD-10-CM

## 2022-02-09 MED ORDER — MUPIROCIN 2 % EX OINT: TOPICAL_OINTMENT | CUTANEOUS | 2 refills | Status: DC

## 2022-02-09 NOTE — Progress Notes (Signed)
   Follow-Up Visit   Subjective  Tiffany Cole is a 89 y.o. female who presents for the following: Skin Problem (The patient has a spot on her left leg to be evaluated, some may be new or changing. ). The patient has spots, moles and lesions to be evaluated, some may be new or changing and the patient has concerns that these could be cancer.  The following portions of the chart were reviewed this encounter and updated as appropriate:   Tobacco  Allergies  Meds  Problems  Med Hx  Surg Hx  Fam Hx     Review of Systems:  No other skin or systemic complaints except as noted in HPI or Assessment and Plan.  Objective  Well appearing patient in no apparent distress; mood and affect are within normal limits.  A focused examination was performed including left lower leg. Relevant physical exam findings are noted in the Assessment and Plan.  left mid medial pretibial 1.2 cm erythematous crusty plaque         Assessment & Plan  Neoplasm of skin left mid medial pretibial  Epidermal / dermal shaving  Lesion diameter (cm):  1.2 Informed consent: discussed and consent obtained   Timeout: patient name, date of birth, surgical site, and procedure verified   Procedure prep:  Patient was prepped and draped in usual sterile fashion Prep type:  Isopropyl alcohol Anesthesia: the lesion was anesthetized in a standard fashion   Anesthetic:  1% lidocaine w/ epinephrine 1-100,000 local infiltration Hemostasis achieved with: pressure, aluminum chloride and electrodesiccation   Outcome: patient tolerated procedure well   Post-procedure details: sterile dressing applied and wound care instructions given   Dressing type: bandage and petrolatum    Destruction of lesion  Destruction method: electrodesiccation and curettage   Informed consent: discussed and consent obtained   Timeout:  patient name, date of birth, surgical site, and procedure verified Anesthesia: the lesion was anesthetized in  a standard fashion   Anesthetic:  1% lidocaine w/ epinephrine 1-100,000 buffered w/ 8.4% NaHCO3 Curettage performed in three different directions: Yes   Electrodesiccation performed over the curetted area: Yes   Curettage cycles:  3 Lesion length (cm):  1.2 Lesion width (cm):  1.2 Margin per side (cm):  0.2 Final wound size (cm):  1.6 Hemostasis achieved with:  electrodesiccation Outcome: patient tolerated procedure well with no complications   Post-procedure details: sterile dressing applied and wound care instructions given   Dressing type: petrolatum    Specimen 1 - Surgical pathology Differential Diagnosis: R/O SCC   Check Margins: No  Related Medications mupirocin ointment (BACTROBAN) 2 % Apply to skin qd-bid  Actinic Damage - chronic, secondary to cumulative UV radiation exposure/sun exposure over time - diffuse scaly erythematous macules with underlying dyspigmentation - Recommend daily broad spectrum sunscreen SPF 30+ to sun-exposed areas, reapply every 2 hours as needed.  - Recommend staying in the shade or wearing long sleeves, sun glasses (UVA+UVB protection) and wide brim hats (4-inch brim around the entire circumference of the hat). - Call for new or changing lesions.  Seborrheic Keratoses - Stuck-on, waxy, tan-brown papules and/or plaques  - Benign-appearing - Discussed benign etiology and prognosis. - Observe - Call for any changes  Return for scheduled appt 05/25/2022.  IMarye Round, CMA, am acting as scribe for [name], MD .  Documentation: I have reviewed the above documentation for accuracy and completeness, and I agree with the above.  [name], MD

## 2022-02-09 NOTE — Patient Instructions (Signed)
Wound Care Instructions  Cleanse wound gently with soap and water once a day then pat dry with clean gauze. Apply a thin coat of Petrolatum (petroleum jelly, "Vaseline") over the wound (unless you have an allergy to this). We recommend that you use a new, sterile tube of Vaseline. Do not pick or remove scabs. Do not remove the yellow or white "healing tissue" from the base of the wound.  Cover the wound with fresh, clean, nonstick gauze and secure with paper tape. You may use Band-Aids in place of gauze and tape if the wound is small enough, but would recommend trimming much of the tape off as there is often too much. Sometimes Band-Aids can irritate the skin.  You should call the office for your biopsy report after 1 week if you have not already been contacted.  If you experience any problems, such as abnormal amounts of bleeding, swelling, significant bruising, significant pain, or evidence of infection, please call the office immediately.  FOR ADULT SURGERY PATIENTS: If you need something for pain relief you may take 1 extra strength Tylenol (acetaminophen) AND 2 Ibuprofen (200mg each) together every 4 hours as needed for pain. (do not take these if you are allergic to them or if you have a reason you should not take them.) Typically, you may only need pain medication for 1 to 3 days.     Due to recent changes in healthcare laws, you may see results of your pathology and/or laboratory studies on MyChart before the doctors have had a chance to review them. We understand that in some cases there may be results that are confusing or concerning to you. Please understand that not all results are received at the same time and often the doctors may need to interpret multiple results in order to provide you with the best plan of care or course of treatment. Therefore, we ask that you please give us 2 business days to thoroughly review all your results before contacting the office for clarification. Should  we see a critical lab result, you will be contacted sooner.   If You Need Anything After Your Visit  If you have any questions or concerns for your doctor, please call our main line at [phone] and press option 4 to reach your doctor's medical assistant. If no one answers, please leave a voicemail as directed and we will return your call as soon as possible. Messages left after 4 pm will be answered the following business day.   You may also send us a message via MyChart. We typically respond to MyChart messages within 1-2 business days.  For prescription refills, please ask your pharmacy to contact our office. Our fax number is [phone].  If you have an urgent issue when the clinic is closed that cannot wait until the next business day, you can page your doctor at the number below.    Please note that while we do our best to be available for urgent issues outside of office hours, we are not available 24/7.   If you have an urgent issue and are unable to reach us, you may choose to seek medical care at your doctor's office, retail clinic, urgent care center, or emergency room.  If you have a medical emergency, please immediately call 911 or go to the emergency department.  Pager Numbers  - Dr. Kowalski: [phone]  - Dr. Moye: [phone]  - Dr. Stewart: [phone]  In the event of inclement weather, please call our main line at   [phone] for an update on the status of any delays or closures.  Dermatology Medication Tips: Please keep the boxes that topical medications come in in order to help keep track of the instructions about where and how to use these. Pharmacies typically print the medication instructions only on the boxes and not directly on the medication tubes.   If your medication is too expensive, please contact our office at [phone] option 4 or send us a message through MyChart.   We are unable to tell what your co-pay for medications will be in  advance as this is different depending on your insurance coverage. However, we may be able to find a substitute medication at lower cost or fill out paperwork to get insurance to cover a needed medication.   If a prior authorization is required to get your medication covered by your insurance company, please allow us 1-2 business days to complete this process.  Drug prices often vary depending on where the prescription is filled and some pharmacies may offer cheaper prices.  The website www.goodrx.com contains coupons for medications through different pharmacies. The prices here do not account for what the cost may be with help from insurance (it may be cheaper with your insurance), but the website can give you the price if you did not use any insurance.  - You can print the associated coupon and take it with your prescription to the pharmacy.  - You may also stop by our office during regular business hours and pick up a GoodRx coupon card.  - If you need your prescription sent electronically to a different pharmacy, notify our office through [facility] MyChart or by phone at [phone] option 4.     Si Usted Necesita Algo Despus de Su Visita  Tambin puede enviarnos un mensaje a travs de MyChart. Por lo general respondemos a los mensajes de MyChart en el transcurso de 1 a 2 das hbiles.  Para renovar recetas, por favor pida a su farmacia que se ponga en contacto con nuestra oficina. Nuestro nmero de fax es el [phone].  Si tiene un asunto urgente cuando la clnica est cerrada y que no puede esperar hasta el siguiente da hbil, puede llamar/localizar a su doctor(a) al nmero que aparece a continuacin.   Por favor, tenga en cuenta que aunque hacemos todo lo posible para estar disponibles para asuntos urgentes fuera del horario de oficina, no estamos disponibles las 24 horas del da, los 7 das de la semana.   Si tiene un problema urgente y no puede comunicarse con nosotros, puede  optar por buscar atencin mdica  en el consultorio de su doctor(a), en una clnica privada, en un centro de atencin urgente o en una sala de emergencias.  Si tiene una emergencia mdica, por favor llame inmediatamente al 911 o vaya a la sala de emergencias.  Nmeros de bper  - Dr. Kowalski: [phone]  - Dra. Moye: [phone]  - Dra. Stewart: [phone]  En caso de inclemencias del tiempo, por favor llame a nuestra lnea principal al [phone] para una actualizacin sobre el estado de cualquier retraso o cierre.  Consejos para la medicacin en dermatologa: Por favor, guarde las cajas en las que vienen los medicamentos de uso tpico para ayudarle a seguir las instrucciones sobre dnde y cmo usarlos. Las farmacias generalmente imprimen las instrucciones del medicamento slo en las cajas y no directamente en los tubos del medicamento.   Si su medicamento es muy caro, por favor, pngase en contacto con   nuestra oficina llamando al [phone] y presione la opcin 4 o envenos un mensaje a travs de MyChart.   No podemos decirle cul ser su copago por los medicamentos por adelantado ya que esto es diferente dependiendo de la cobertura de su seguro. Sin embargo, es posible que podamos encontrar un medicamento sustituto a menor costo o llenar un formulario para que el seguro cubra el medicamento que se considera necesario.   Si se requiere una autorizacin previa para que su compaa de seguros cubra su medicamento, por favor permtanos de 1 a 2 das hbiles para completar este proceso.  Los precios de los medicamentos varan con frecuencia dependiendo del lugar de dnde se surte la receta y alguna farmacias pueden ofrecer precios ms baratos.  El sitio web www.goodrx.com tiene cupones para medicamentos de diferentes farmacias. Los precios aqu no tienen en cuenta lo que podra costar con la ayuda del seguro (puede ser ms barato con su seguro), pero el sitio web puede darle el  precio si no utiliz ningn seguro.  - Puede imprimir el cupn correspondiente y llevarlo con su receta a la farmacia.  - Tambin puede pasar por nuestra oficina durante el horario de atencin regular y recoger una tarjeta de cupones de GoodRx.  - Si necesita que su receta se enve electrnicamente a una farmacia diferente, informe a nuestra oficina a travs de MyChart de [facility] o por telfono llamando al [phone] y presione la opcin 4.  

## 2022-02-14 ENCOUNTER — Encounter: Payer: Self-pay | Admitting: Dermatology

## 2022-02-15 ENCOUNTER — Telehealth: Payer: Self-pay

## 2022-02-15 NOTE — Telephone Encounter (Signed)
Advised patient of pathology results/hd 

## 2022-02-15 NOTE — Telephone Encounter (Signed)
-----   Message from Ralene Bathe, MD sent at 02/14/2022  6:32 PM EST ----- Diagnosis Skin , left mid medial pretibial WELL DIFFERENTIATED SQUAMOUS CELL CARCINOMA  Cancer - SCC Already treated Recheck next visit

## 2022-03-07 ENCOUNTER — Other Ambulatory Visit: Payer: Self-pay | Admitting: Physician Assistant

## 2022-03-07 DIAGNOSIS — M542 Cervicalgia: Secondary | ICD-10-CM

## 2022-03-07 DIAGNOSIS — R519 Headache, unspecified: Secondary | ICD-10-CM

## 2022-03-07 DIAGNOSIS — R2689 Other abnormalities of gait and mobility: Secondary | ICD-10-CM

## 2022-03-07 DIAGNOSIS — H539 Unspecified visual disturbance: Secondary | ICD-10-CM

## 2022-03-07 DIAGNOSIS — R42 Dizziness and giddiness: Secondary | ICD-10-CM

## 2022-03-20 ENCOUNTER — Ambulatory Visit
Admission: RE | Admit: 2022-03-20 | Discharge: 2022-03-20 | Disposition: A | Discharge status: Home / Self Care | Payer: Medicare Other | Source: Ambulatory Visit | Attending: Physician Assistant | Admitting: Physician Assistant

## 2022-03-20 DIAGNOSIS — R2689 Other abnormalities of gait and mobility: Secondary | ICD-10-CM

## 2022-03-20 DIAGNOSIS — R42 Dizziness and giddiness: Secondary | ICD-10-CM

## 2022-03-20 DIAGNOSIS — M542 Cervicalgia: Secondary | ICD-10-CM | POA: Diagnosis present

## 2022-03-20 DIAGNOSIS — H539 Unspecified visual disturbance: Secondary | ICD-10-CM

## 2022-03-20 DIAGNOSIS — R519 Headache, unspecified: Secondary | ICD-10-CM

## 2022-05-25 ENCOUNTER — Ambulatory Visit: Payer: Medicare Other | Admitting: Dermatology

## 2022-05-30 ENCOUNTER — Other Ambulatory Visit: Payer: Self-pay | Admitting: Physician Assistant

## 2022-05-30 ENCOUNTER — Ambulatory Visit
Admission: RE | Admit: 2022-05-30 | Discharge: 2022-05-30 | Disposition: A | Discharge status: Home / Self Care | Payer: Medicare Other | Source: Ambulatory Visit | Attending: Physician Assistant | Admitting: Physician Assistant

## 2022-05-30 ENCOUNTER — Ambulatory Visit (INDEPENDENT_AMBULATORY_CARE_PROVIDER_SITE_OTHER): Payer: Medicare Other | Admitting: Physician Assistant

## 2022-05-30 ENCOUNTER — Ambulatory Visit
Admission: RE | Admit: 2022-05-30 | Discharge: 2022-05-30 | Disposition: A | Discharge status: Home / Self Care | Payer: Medicare Other | Attending: Physician Assistant | Admitting: Physician Assistant

## 2022-05-30 VITALS — BP 134/66 | HR 80 | Ht 65.0 in | Wt 123.0 lb

## 2022-05-30 DIAGNOSIS — Z87442 Personal history of urinary calculi: Secondary | ICD-10-CM | POA: Insufficient documentation

## 2022-05-30 DIAGNOSIS — R109 Unspecified abdominal pain: Secondary | ICD-10-CM

## 2022-05-30 LAB — MICROSCOPIC EXAMINATION: RBC, Urine: >30 /hpf — AB (ref 0–2)

## 2022-05-30 LAB — URINALYSIS, COMPLETE
Bilirubin, UA: NEGATIVE
Glucose, UA: NEGATIVE
Leukocytes,UA: NEGATIVE
Nitrite, UA: NEGATIVE
Specific Gravity, UA: 1.015 (ref 1.005–1.030)
Urobilinogen, Ur: 0.2 mg/dL (ref 0.2–1.0)
pH, UA: 5.5 (ref 5.0–7.5)

## 2022-05-30 NOTE — Progress Notes (Unsigned)
05/30/2022 4:44 PM   Tiffany Cole Dec 29, 1928 [id number]  CC: Chief Complaint  Patient presents with   Follow-up   HPI: Tiffany Cole is a 89 y.o. female with PMH nephrolithiasis who presents today for evaluation of a possible acute stone episode.   Today she reports an approximate 1 week history of intermittent gross hematuria and right flank discomfort.  She had an episode of vomiting earlier today.  She denies fevers and is currently pain-free.  KUB today with bilateral renal stones, no evidence of ureteral stones.  In-office UA today positive for trace ketones, 3+ blood, and 3+ protein; urine microscopy with >30 RBCs/HPF and moderate bacteria.   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 Left pretibial   Squamous cell carcinoma of left lower leg 09/07/2014   Well differentiated SCC with superficial infiltration.  Left pretibial   Squamous cell carcinoma of left lower leg 01/01/2017   Well differentiated SCC with superficial infiltration.  Left pretibial mid.   Squamous cell carcinoma of right lower leg 01/07/2008   Well differentiated SCC. Right lower leg   Squamous cell carcinoma of right lower leg 07/23/2012   Well differentiated SCC with superficial infiltration. Right pretibial   Squamous cell carcinoma of right lower leg 02/27/2019   SCCis, hypertrophic. Right distal lateral pretibial.   Squamous cell carcinoma of right lower leg 02/27/2019   Well differentiated SCC. Right distal medial pretibial.   Squamous cell carcinoma of skin 10/01/2006   Well differentiated SCC with superficial  infiltration. Inferior lateral pretibial.   Squamous cell carcinoma of skin 04/10/2019   Well differentiated SCC with superficial infiltration. Left distal anterior thigh.   Squamous cell carcinoma of skin 07/17/2019   Left dorsum hand. WD SCC, deep margin involved.   Squamous cell carcinoma of skin 06/06/2021   Right Anterior Pretibial, EDC   Squamous cell carcinoma of skin 02/09/2022   Left mid medial pretibial - EDC    Surgical History: Past Surgical History:  Procedure Laterality Date   ANKLE SURGERY     APPENDECTOMY     EXTRACORPOREAL SHOCK WAVE LITHOTRIPSY Right 05/25/2016   Procedure: EXTRACORPOREAL SHOCK WAVE LITHOTRIPSY (ESWL);  Surgeon: Vanna Scotland, MD;  Location: [facility];  Service: Urology;  Laterality: Right;   EXTRACORPOREAL SHOCK WAVE LITHOTRIPSY Left 10/11/2017   Procedure: EXTRACORPOREAL SHOCK WAVE LITHOTRIPSY (ESWL);  Surgeon: Vanna Scotland, MD;  Location: [facility];  Service: Urology;  Laterality: Left;   EXTRACORPOREAL SHOCK WAVE LITHOTRIPSY Right 01/13/2021   Procedure: EXTRACORPOREAL SHOCK WAVE LITHOTRIPSY (ESWL);  Surgeon: Sondra Come, MD;  Location: [facility];  Service: Urology;  Laterality: Right;   JOINT REPLACEMENT Left 2016   knee   TONSILLECTOMY     TUBAL LIGATION      Home Medications:  Allergies as of 05/30/2022       Reactions   Diazepam Other (See Comments)   Stops breathing (oversedation)        Medication List        Accurate as of May 30, 2022  4:44 PM. If you have any questions, ask your nurse or doctor.  meclizine 12.5 MG tablet Commonly known as: ANTIVERT Take 12.5 mg by mouth 3 (three) times daily as needed for dizziness.   mupirocin ointment 2 % Commonly known as: BACTROBAN Apply to skin qd-bid   tamsulosin 0.4 MG Caps capsule Commonly known as: FLOMAX Take 1 capsule (0.4 mg total) by mouth daily after supper.        Allergies:  Allergies  Allergen Reactions   Diazepam Other (See Comments)     Stops breathing (oversedation)    Family History: Family History  Problem Relation Age of Onset   Prostate cancer Brother    Kidney cancer Neg Hx    Bladder Cancer Neg Hx    Breast cancer Neg Hx     Social History:   reports that she has never smoked. She has never used smokeless tobacco. She reports that she does not drink alcohol and does not use drugs.  Physical Exam: BP 134/66   Pulse 80   Ht 5\' 5"  (1.651 m)   Wt 123 lb (55.8 kg)   BMI 20.47 kg/m   Constitutional:  Alert and oriented, no acute distress, nontoxic appearing HEENT: [place], AT Cardiovascular: No clubbing, cyanosis, or edema Respiratory: Normal respiratory effort, no increased work of breathing Skin: No rashes, bruises or suspicious lesions Neurologic: Grossly intact, no focal deficits, moving all 4 extremities Psychiatric: Normal mood and affect  Laboratory Data: Results for orders placed or performed in visit on 05/30/22  Microscopic Examination   Urine  Result Value Ref Range   WBC, UA 0-5 0 - 5 /hpf   RBC, Urine >30 (A) 0 - 2 /hpf   Epithelial Cells (non renal) 0-10 0 - 10 /hpf   Crystals Present (A) N/A   Crystal Type Amorphous Sediment N/A   Mucus, UA Present (A) Not Estab.   Bacteria, UA Moderate (A) None seen/Few  Urinalysis, Complete  Result Value Ref Range   Specific Gravity, UA 1.015 1.005 - 1.030   pH, UA 5.5 5.0 - 7.5   Color, UA Red (A) Yellow   Appearance Ur Cloudy (A) Clear   Leukocytes,UA Negative Negative   Protein,UA 3+ (A) Negative/Trace   Glucose, UA Negative Negative   Ketones, UA Trace (A) Negative   RBC, UA 3+ (A) Negative   Bilirubin, UA Negative Negative   Urobilinogen, Ur 0.2 0.2 - 1.0 mg/dL   Nitrite, UA Negative Negative   Microscopic Examination See below:    Pertinent Imaging: KUB, 05/30/2022: CLINICAL DATA:  Flank pain   EXAM: ABDOMEN - 1 VIEW   COMPARISON:  02/03/2021   FINDINGS: The bowel gas pattern is normal. Upper abdominal calcifications bilaterally  could represent stones measuring up to 2 cm on each side.   IMPRESSION: Calcifications that may represent stones overlying bilateral upper abdomen.     Electronically Signed   By: Layla Maw M.D.   On: 05/30/2022 22:04  I personally reviewed the images referenced above and note no radiopaque ureteral stones.  Assessment & Plan:   1. Flank pain with history of urolithiasis Intermittent gross hematuria and right flank discomfort as well as an episode of emesis in this patient with a history of nephrolithiasis.  Her UA is notable for gross hematuria today.  Recommend CT stone study for further evaluation, with no ureteral stone visualized on KUB.  She is in agreement with this plan.  Will contact patient with results when available. - Urinalysis, Complete - CT RENAL STONE STUDY; Future - CULTURE, URINE COMPREHENSIVE   Return for  Will call with results.  Carman Ching, PA-C  [facility] [place] [address] [place], [place] [address] [phone]

## 2022-05-30 NOTE — H&P (View-Only) (Signed)
 05/30/2022 4:44 PM   Tiffany Cole 09/09/1928 [id number]  CC: Chief Complaint  Patient presents with   Follow-up   HPI: Tiffany Cole is a 89 y.o. female with PMH nephrolithiasis who presents today for evaluation of a possible acute stone episode.   Today she reports an approximate 1 week history of intermittent gross hematuria and right flank discomfort.  She had an episode of vomiting earlier today.  She denies fevers and is currently pain-free.  KUB today with bilateral renal stones, no evidence of ureteral stones.  In-office UA today positive for trace ketones, 3+ blood, and 3+ protein; urine microscopy with >30 RBCs/HPF and moderate bacteria.   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 Left pretibial   Squamous cell carcinoma of left lower leg 09/07/2014   Well differentiated SCC with superficial infiltration.  Left pretibial   Squamous cell carcinoma of left lower leg 01/01/2017   Well differentiated SCC with superficial infiltration.  Left pretibial mid.   Squamous cell carcinoma of right lower leg 01/07/2008   Well differentiated SCC. Right lower leg   Squamous cell carcinoma of right lower leg 07/23/2012   Well differentiated SCC with superficial infiltration. Right pretibial   Squamous cell carcinoma of right lower leg 02/27/2019   SCCis, hypertrophic. Right distal lateral pretibial.   Squamous cell carcinoma of right lower leg 02/27/2019   Well differentiated SCC. Right distal medial pretibial.   Squamous cell carcinoma of skin 10/01/2006   Well differentiated SCC with superficial  infiltration. Inferior lateral pretibial.   Squamous cell carcinoma of skin 04/10/2019   Well differentiated SCC with superficial infiltration. Left distal anterior thigh.   Squamous cell carcinoma of skin 07/17/2019   Left dorsum hand. WD SCC, deep margin involved.   Squamous cell carcinoma of skin 06/06/2021   Right Anterior Pretibial, EDC   Squamous cell carcinoma of skin 02/09/2022   Left mid medial pretibial - EDC    Surgical History: Past Surgical History:  Procedure Laterality Date   ANKLE SURGERY     APPENDECTOMY     EXTRACORPOREAL SHOCK WAVE LITHOTRIPSY Right 05/25/2016   Procedure: EXTRACORPOREAL SHOCK WAVE LITHOTRIPSY (ESWL);  Surgeon: Ashley Brandon, MD;  Location: [facility];  Service: Urology;  Laterality: Right;   EXTRACORPOREAL SHOCK WAVE LITHOTRIPSY Left 10/11/2017   Procedure: EXTRACORPOREAL SHOCK WAVE LITHOTRIPSY (ESWL);  Surgeon: Brandon, Ashley, MD;  Location: [facility];  Service: Urology;  Laterality: Left;   EXTRACORPOREAL SHOCK WAVE LITHOTRIPSY Right 01/13/2021   Procedure: EXTRACORPOREAL SHOCK WAVE LITHOTRIPSY (ESWL);  Surgeon: Sninsky, Brian C, MD;  Location: [facility];  Service: Urology;  Laterality: Right;   JOINT REPLACEMENT Left 2016   knee   TONSILLECTOMY     TUBAL LIGATION      Home Medications:  Allergies as of 05/30/2022       Reactions   Diazepam Other (See Comments)   Stops breathing (oversedation)        Medication List        Accurate as of May 30, 2022  4:44 PM. If you have any questions, ask your nurse or doctor.            meclizine 12.5 MG tablet Commonly known as: ANTIVERT Take 12.5 mg by mouth 3 (three) times daily as needed for dizziness.   mupirocin ointment 2 % Commonly known as: BACTROBAN Apply to skin qd-bid   tamsulosin 0.4 MG Caps capsule Commonly known as: FLOMAX Take 1 capsule (0.4 mg total) by mouth daily after supper.        Allergies:  Allergies  Allergen Reactions   Diazepam Other (See Comments)     Stops breathing (oversedation)    Family History: Family History  Problem Relation Age of Onset   Prostate cancer Brother    Kidney cancer Neg Hx    Bladder Cancer Neg Hx    Breast cancer Neg Hx     Social History:   reports that she has never smoked. She has never used smokeless tobacco. She reports that she does not drink alcohol and does not use drugs.  Physical Exam: BP 134/66   Pulse 80   Ht 5' 5" (1.651 m)   Wt 123 lb (55.8 kg)   BMI 20.47 kg/m   Constitutional:  Alert and oriented, no acute distress, nontoxic appearing HEENT: [place], AT Cardiovascular: No clubbing, cyanosis, or edema Respiratory: Normal respiratory effort, no increased work of breathing Skin: No rashes, bruises or suspicious lesions Neurologic: Grossly intact, no focal deficits, moving all 4 extremities Psychiatric: Normal mood and affect  Laboratory Data: Results for orders placed or performed in visit on 05/30/22  Microscopic Examination   Urine  Result Value Ref Range   WBC, UA 0-5 0 - 5 /hpf   RBC, Urine >30 (A) 0 - 2 /hpf   Epithelial Cells (non renal) 0-10 0 - 10 /hpf   Crystals Present (A) N/A   Crystal Type Amorphous Sediment N/A   Mucus, UA Present (A) Not Estab.   Bacteria, UA Moderate (A) None seen/Few  Urinalysis, Complete  Result Value Ref Range   Specific Gravity, UA 1.015 1.005 - 1.030   pH, UA 5.5 5.0 - 7.5   Color, UA Red (A) Yellow   Appearance Ur Cloudy (A) Clear   Leukocytes,UA Negative Negative   Protein,UA 3+ (A) Negative/Trace   Glucose, UA Negative Negative   Ketones, UA Trace (A) Negative   RBC, UA 3+ (A) Negative   Bilirubin, UA Negative Negative   Urobilinogen, Ur 0.2 0.2 - 1.0 mg/dL   Nitrite, UA Negative Negative   Microscopic Examination See below:    Pertinent Imaging: KUB, 05/30/2022: CLINICAL DATA:  Flank pain   EXAM: ABDOMEN - 1 VIEW   COMPARISON:  02/03/2021   FINDINGS: The bowel gas pattern is normal. Upper abdominal calcifications bilaterally  could represent stones measuring up to 2 cm on each side.   IMPRESSION: Calcifications that may represent stones overlying bilateral upper abdomen.     Electronically Signed   By: Joshua  Pleasure M.D.   On: 05/30/2022 22:04  I personally reviewed the images referenced above and note no radiopaque ureteral stones.  Assessment & Plan:   1. Flank pain with history of urolithiasis Intermittent gross hematuria and right flank discomfort as well as an episode of emesis in this patient with a history of nephrolithiasis.  Her UA is notable for gross hematuria today.  Recommend CT stone study for further evaluation, with no ureteral stone visualized on KUB.  She is in agreement with this plan.  Will contact patient with results when available. - Urinalysis, Complete - CT RENAL STONE STUDY; Future - CULTURE, URINE COMPREHENSIVE   Return for   Will call with results.  Asaiah Hunnicutt, PA-C  [facility] [place] [address], [address] [place], [place] [address] [phone] 

## 2022-05-31 ENCOUNTER — Ambulatory Visit
Admission: RE | Admit: 2022-05-31 | Discharge: 2022-05-31 | Disposition: A | Discharge status: Home / Self Care | Payer: Medicare Other | Source: Ambulatory Visit | Attending: Physician Assistant | Admitting: Physician Assistant

## 2022-05-31 ENCOUNTER — Other Ambulatory Visit: Payer: Self-pay | Admitting: Urology

## 2022-05-31 ENCOUNTER — Telehealth: Payer: Self-pay | Admitting: Urology

## 2022-05-31 DIAGNOSIS — R109 Unspecified abdominal pain: Secondary | ICD-10-CM | POA: Diagnosis not present

## 2022-05-31 DIAGNOSIS — Z87442 Personal history of urinary calculi: Secondary | ICD-10-CM | POA: Insufficient documentation

## 2022-05-31 DIAGNOSIS — N201 Calculus of ureter: Secondary | ICD-10-CM

## 2022-05-31 NOTE — Telephone Encounter (Signed)
Spoke with patient regarding her CT report.  I explained to her that she has bilateral stones, but more importantly she has 8 mm distal stone in the right ureter that she will likely not be able to pass on her own.  I explained to her that since it was not visible on KUB, she will have to proceed with ureteroscopy for definitive treatment of the stones.   - explained to the patient how the procedure is performed and the risks involved  -informed the patient that they will have an ureteral stent, which will remain in place for approximately 3-10 days and can be associated with flank pain, bladder pain, dysuria, urgency, frequency, urinary leakage, and gross hematuria.  - stent may be removed in the office with a cystoscope or patient may be instructed to remove the stent themselves by the string and that is decided on the day of the procedure  - residual stones within the kidney or ureter may be present after the procedure and may need to have these addressed at a different encounter  - injury to the ureter is the most common intra-operative risk, it may result in an open procedure to correct the defect  - infection and bleeding are also risks  - explained the risks of general anesthesia, such as: MI, CVA, paralysis, coma and/or death.  - advised to contact our office or seek treatment in the ED if becomes febrile or pain/ vomiting are difficult control in order to arrange for emergent/urgent intervention  She would like to proceed with the right URS/LL/ureteral stent placement.  Orders sent to [facility].

## 2022-05-31 NOTE — Progress Notes (Unsigned)
Surgical Physician Order Form [facility] [place]  * Scheduling expectation : ASAP  *Length of Case:   *Clearance needed: no  *Anticoagulation Instructions: Hold all anticoagulants  *Aspirin Instructions: Hold Aspirin  *Post-op visit Date/Instructions:   TBD  *Diagnosis: Right Ureteral Stone  *Procedure: right Ureteroscopy w/laser lithotripsy & stent placement ([address])   Additional orders: N/A  -Admit type: OUTpatient  -Anesthesia: General  -VTE Prophylaxis Standing Order SCD's       Other:   -Standing Lab Orders Per Anesthesia    Lab other: None  -Standing Test orders EKG/Chest x-ray per Anesthesia       Test other:   - Medications:  Ancef 2gm IV  -Other orders:  N/A

## 2022-06-01 MED ORDER — ORAL CARE MOUTH RINSE: 15.0000 mL | Freq: Once | OROMUCOSAL | Status: AC

## 2022-06-01 MED ORDER — LACTATED RINGERS IV SOLN: INTRAVENOUS | Status: DC

## 2022-06-01 MED ORDER — CEFAZOLIN SODIUM-DEXTROSE 2-4 GM/100ML-% IV SOLN
2.0000 g | INTRAVENOUS | Status: AC
  Administered 2022-06-02: 2 g via INTRAVENOUS

## 2022-06-01 MED ORDER — CHLORHEXIDINE GLUCONATE 0.12 % MT SOLN
15.0000 mL | Freq: Once | OROMUCOSAL | Status: AC
  Administered 2022-06-02: 15 mL via OROMUCOSAL

## 2022-06-01 NOTE — Progress Notes (Signed)
   [facility] Surgical Posting Form  Surgery Date: Date: 06/02/2022  Surgeon: Dr. Legrand Rams, MD  Inpt ( No  )   Outpt (Yes)   Obs ( No  )   Diagnosis: N20.1 Right Ureteral Stone  -CPT: [phone]  Surgery: Right Ureteroscopy with Laser Lithotripsy and Stent Placement   Stop Anticoagulations: No  Cardiac/Medical/Pulmonary Clearance needed: no  *Orders entered into EPIC  Date: 06/01/22   *Case booked in [place]  Date: 06/01/22  *Notified pt of Surgery: Date: 06/01/22  PRE-OP UA & CX: no  *Placed into Prior Authorization Work [place] Date: 06/01/22  Assistant/laser/rep:No

## 2022-06-02 ENCOUNTER — Ambulatory Visit: Payer: Medicare Other

## 2022-06-02 ENCOUNTER — Ambulatory Visit: Payer: Medicare Other | Admitting: Anesthesiology

## 2022-06-02 ENCOUNTER — Encounter
Admission: RE | Disposition: A | Discharge status: Home / Self Care | Payer: Self-pay | Source: Home / Self Care | Attending: Urology

## 2022-06-02 ENCOUNTER — Encounter: Payer: Self-pay | Admitting: Urology

## 2022-06-02 ENCOUNTER — Other Ambulatory Visit: Payer: Self-pay

## 2022-06-02 ENCOUNTER — Ambulatory Visit
Admission: RE | Admit: 2022-06-02 | Discharge: 2022-06-02 | Disposition: A | Discharge status: Home / Self Care | Payer: Medicare Other | Attending: Urology | Admitting: Urology

## 2022-06-02 DIAGNOSIS — N201 Calculus of ureter: Secondary | ICD-10-CM

## 2022-06-02 DIAGNOSIS — I340 Nonrheumatic mitral (valve) insufficiency: Secondary | ICD-10-CM | POA: Diagnosis not present

## 2022-06-02 DIAGNOSIS — R079 Chest pain, unspecified: Secondary | ICD-10-CM

## 2022-06-02 DIAGNOSIS — N202 Calculus of kidney with calculus of ureter: Secondary | ICD-10-CM

## 2022-06-02 DIAGNOSIS — Z96652 Presence of left artificial knee joint: Secondary | ICD-10-CM

## 2022-06-02 DIAGNOSIS — R002 Palpitations: Secondary | ICD-10-CM

## 2022-06-02 DIAGNOSIS — E7800 Pure hypercholesterolemia, unspecified: Secondary | ICD-10-CM

## 2022-06-02 HISTORY — PX: CYSTOSCOPY/URETEROSCOPY/HOLMIUM LASER/STENT PLACEMENT: SHX6546

## 2022-06-02 SURGERY — CYSTOSCOPY/URETEROSCOPY/HOLMIUM LASER/STENT PLACEMENT
Anesthesia: General | Laterality: Right

## 2022-06-02 MED ORDER — ROCURONIUM BROMIDE 100 MG/10ML IV SOLN
INTRAVENOUS | Status: DC | PRN
  Administered 2022-06-02: 30 mg via INTRAVENOUS

## 2022-06-02 MED ORDER — ONDANSETRON HCL 4 MG/2ML IJ SOLN
INTRAMUSCULAR | Status: DC | PRN
  Administered 2022-06-02: 4 mg via INTRAVENOUS

## 2022-06-02 MED ORDER — PROPOFOL 10 MG/ML IV BOLUS
INTRAVENOUS | Status: DC | PRN
  Administered 2022-06-02: 30 mg via INTRAVENOUS
  Administered 2022-06-02: 90 mg via INTRAVENOUS

## 2022-06-02 MED ORDER — HYDRALAZINE HCL 20 MG/ML IJ SOLN
INTRAMUSCULAR | Status: AC
  Filled 2022-06-02: qty 1

## 2022-06-02 MED ORDER — ACETAMINOPHEN 10 MG/ML IV SOLN
INTRAVENOUS | Status: AC
  Filled 2022-06-02: qty 100

## 2022-06-02 MED ORDER — DEXMEDETOMIDINE HCL IN NACL 80 MCG/20ML IV SOLN
INTRAVENOUS | Status: DC | PRN
  Administered 2022-06-02: 4 ug via INTRAVENOUS

## 2022-06-02 MED ORDER — SODIUM CHLORIDE 0.9 % IR SOLN
Status: DC | PRN
  Administered 2022-06-02: 3000 mL via INTRAVESICAL

## 2022-06-02 MED ORDER — FENTANYL CITRATE (PF) 100 MCG/2ML IJ SOLN: 25.0000 ug | INTRAMUSCULAR | Status: DC | PRN

## 2022-06-02 MED ORDER — ACETAMINOPHEN 10 MG/ML IV SOLN: 1000.0000 mg | Freq: Once | INTRAVENOUS | Status: DC | PRN

## 2022-06-02 MED ORDER — LIDOCAINE HCL (CARDIAC) PF 100 MG/5ML IV SOSY
PREFILLED_SYRINGE | INTRAVENOUS | Status: DC | PRN
  Administered 2022-06-02: 60 mg via INTRAVENOUS

## 2022-06-02 MED ORDER — DEXAMETHASONE SODIUM PHOSPHATE 10 MG/ML IJ SOLN
INTRAMUSCULAR | Status: DC | PRN
  Administered 2022-06-02: 5 mg via INTRAVENOUS

## 2022-06-02 MED ORDER — IOHEXOL 180 MG/ML  SOLN
INTRAMUSCULAR | Status: DC | PRN
  Administered 2022-06-02: 10 mL

## 2022-06-02 MED ORDER — OXYCODONE HCL 5 MG PO TABS: 5.0000 mg | ORAL_TABLET | Freq: Once | ORAL | Status: DC | PRN

## 2022-06-02 MED ORDER — CEFAZOLIN SODIUM-DEXTROSE 2-4 GM/100ML-% IV SOLN
INTRAVENOUS | Status: AC
  Filled 2022-06-02: qty 100

## 2022-06-02 MED ORDER — OXYCODONE HCL 5 MG/5ML PO SOLN: 5.0000 mg | Freq: Once | ORAL | Status: DC | PRN

## 2022-06-02 MED ORDER — ONDANSETRON HCL 4 MG/2ML IJ SOLN
INTRAMUSCULAR | Status: AC
  Filled 2022-06-02: qty 2

## 2022-06-02 MED ORDER — HYDRALAZINE HCL 20 MG/ML IJ SOLN
5.0000 mg | Freq: Once | INTRAMUSCULAR | Status: AC
  Administered 2022-06-02: 5 mg via INTRAVENOUS

## 2022-06-02 MED ORDER — ACETAMINOPHEN 10 MG/ML IV SOLN
INTRAVENOUS | Status: DC | PRN
  Administered 2022-06-02: 1000 mg via INTRAVENOUS

## 2022-06-02 MED ORDER — PROPOFOL 10 MG/ML IV BOLUS
INTRAVENOUS | Status: AC
  Filled 2022-06-02: qty 20

## 2022-06-02 MED ORDER — DEXAMETHASONE SODIUM PHOSPHATE 10 MG/ML IJ SOLN
INTRAMUSCULAR | Status: AC
  Filled 2022-06-02: qty 1

## 2022-06-02 MED ORDER — LIDOCAINE HCL (PF) 2 % IJ SOLN
INTRAMUSCULAR | Status: AC
  Filled 2022-06-02: qty 5

## 2022-06-02 MED ORDER — FENTANYL CITRATE (PF) 100 MCG/2ML IJ SOLN
INTRAMUSCULAR | Status: AC
  Filled 2022-06-02: qty 2

## 2022-06-02 MED ORDER — ROCURONIUM BROMIDE 10 MG/ML (PF) SYRINGE
PREFILLED_SYRINGE | INTRAVENOUS | Status: AC
  Filled 2022-06-02: qty 10

## 2022-06-02 MED ORDER — HYDROCODONE-ACETAMINOPHEN 5-325 MG PO TABS: 0.5000 | ORAL_TABLET | Freq: Four times a day (QID) | ORAL | 0 refills | Status: AC | PRN

## 2022-06-02 MED ORDER — CHLORHEXIDINE GLUCONATE 0.12 % MT SOLN
OROMUCOSAL | Status: AC
  Filled 2022-06-02: qty 15

## 2022-06-02 MED ORDER — SUGAMMADEX SODIUM 200 MG/2ML IV SOLN
INTRAVENOUS | Status: DC | PRN
  Administered 2022-06-02: 111.6 mg via INTRAVENOUS

## 2022-06-02 MED ORDER — FENTANYL CITRATE (PF) 100 MCG/2ML IJ SOLN
INTRAMUSCULAR | Status: DC | PRN
  Administered 2022-06-02: 50 ug via INTRAVENOUS

## 2022-06-02 MED ORDER — ONDANSETRON HCL 4 MG/2ML IJ SOLN
4.0000 mg | Freq: Once | INTRAMUSCULAR | Status: AC | PRN
  Administered 2022-06-02: 4 mg via INTRAVENOUS

## 2022-06-02 SURGICAL SUPPLY — 32 items
ADH LQ OCL WTPRF AMP STRL LF (MISCELLANEOUS)
ADHESIVE MASTISOL STRL (MISCELLANEOUS) IMPLANT
BAG DRAIN SIEMENS DORNER NS (MISCELLANEOUS) ×1 IMPLANT
BAG DRN NS LF (MISCELLANEOUS) ×1
BAG PRESSURE INF REUSE 3000 (BAG) ×1 IMPLANT
BRUSH SCRUB EZ 1% IODOPHOR (MISCELLANEOUS) ×1 IMPLANT
CATH URET FLEX-TIP 2 LUMEN 10F (CATHETERS) IMPLANT
CATH URETL OPEN 5X70 (CATHETERS) IMPLANT
CNTNR URN SCR LID CUP LEK RST (MISCELLANEOUS) IMPLANT
CONT SPEC 4OZ STRL OR WHT (MISCELLANEOUS)
DRAPE UTILITY 15X26 TOWEL STRL (DRAPES) ×1 IMPLANT
DRSG TEGADERM 2-3/8X2-3/4 SM (GAUZE/BANDAGES/DRESSINGS) IMPLANT
FIBER LASER MOSES 200 DFL (Laser) IMPLANT
FIBER LASER MOSES 365 DFL (Laser) IMPLANT
GLOVE BIOGEL PI IND STRL 7.5 (GLOVE) ×1 IMPLANT
GOWN STRL REUS W/ TWL LRG LVL3 (GOWN DISPOSABLE) ×1 IMPLANT
GOWN STRL REUS W/ TWL XL LVL3 (GOWN DISPOSABLE) ×1 IMPLANT
GOWN STRL REUS W/TWL LRG LVL3 (GOWN DISPOSABLE) ×1
GOWN STRL REUS W/TWL XL LVL3 (GOWN DISPOSABLE) ×1
GUIDEWIRE STR DUAL SENSOR (WIRE) ×1 IMPLANT
IV NS IRRIG 3000ML ARTHROMATIC (IV SOLUTION) ×1 IMPLANT
KIT TURNOVER CYSTO (KITS) ×1 IMPLANT
PACK CYSTO AR (MISCELLANEOUS) ×1 IMPLANT
SET CYSTO W/LG BORE CLAMP LF (SET/KITS/TRAYS/PACK) ×1 IMPLANT
SHEATH NAVIGATOR HD 12/14X36 (SHEATH) IMPLANT
STENT URET 6FRX24 CONTOUR (STENTS) IMPLANT
STENT URET 6FRX26 CONTOUR (STENTS) IMPLANT
SURGILUBE 2OZ TUBE FLIPTOP (MISCELLANEOUS) ×1 IMPLANT
SYR 10ML LL (SYRINGE) ×1 IMPLANT
TRAP FLUID SMOKE EVACUATOR (MISCELLANEOUS) ×1 IMPLANT
VALVE UROSEAL ADJ ENDO (VALVE) IMPLANT
WATER STERILE IRR 500ML POUR (IV SOLUTION) ×1 IMPLANT

## 2022-06-02 NOTE — Transfer of Care (Signed)
Immediate Anesthesia Transfer of Care Note  Patient: Tiffany Cole  Procedure(s) Performed: CYSTOSCOPY/URETEROSCOPY/HOLMIUM LASER/STENT PLACEMENT (Right)  Patient Location: PACU  Anesthesia Type:General  Level of Consciousness: awake, alert , and oriented  Airway & Oxygen Therapy: Patient Spontanous Breathing and Patient connected to face mask oxygen  Post-op Assessment: Report given to RN and Post -op Vital signs reviewed and stable  Post vital signs: Reviewed and stable  Last Vitals:  Vitals Value Taken Time  BP 174/67 06/02/22 1408  Temp 97.2   Pulse 62 06/02/22 1410  Resp 16 06/02/22 1410  SpO2 100 % 06/02/22 1410  Vitals shown include unvalidated device data.  Last Pain:  Vitals:   06/02/22 1146  TempSrc: Temporal  PainSc: 0-No pain         Complications: No notable events documented.

## 2022-06-02 NOTE — Anesthesia Procedure Notes (Signed)
Procedure Name: Intubation Date/Time: 06/02/2022 1:17 PM  Performed by: Kenise Barraco, [place], CRNAPre-anesthesia Checklist: Patient identified, Patient being monitored, Timeout performed, Emergency Drugs available and Suction available Patient Re-evaluated:Patient Re-evaluated prior to induction Oxygen Delivery Method: Circle system utilized Preoxygenation: Pre-oxygenation with 100% oxygen Induction Type: IV induction Ventilation: Mask ventilation without difficulty Laryngoscope Size: 3 and McGraph Grade View: Grade I Tube type: Oral Tube size: 7.0 mm Number of attempts: 1 Airway Equipment and Method: Stylet Placement Confirmation: ETT inserted through vocal cords under direct vision, positive ETCO2 and breath sounds checked- equal and bilateral Secured at: 22 cm Tube secured with: Tape Dental Injury: Teeth and Oropharynx as per pre-operative assessment

## 2022-06-02 NOTE — Op Note (Signed)
Date of procedure: 06/02/22  Preoperative diagnosis:  Right mid ureteral stone Right renal stones  Postoperative diagnosis:  Same  Procedure: Cystoscopy, right retrograde pyelogram with intraoperative interpretation Right ureteroscopy laser lithotripsy of ureteral stone Right ureteroscopy and laser lithotripsy of multiple renal stones  Surgeon: Legrand Rams, MD  Anesthesia: General  Complications: None  Intraoperative findings:  Normal appearing bladder Significant erythema at impacted mid ureteral stone, stone dusted All renal stones dusted  EBL: Minimal  Specimens: None  Drains: Right 6 French by 26 cm ureteral stent  Indication: Tiffany Cole is a 89 y.o. patient with 8 mm right mid ureteral stone, multiple right renal stones and flank pain and nausea who opted for ureteroscopy.  After reviewing the management options for treatment, they elected to proceed with the above surgical procedure(s). We have discussed the potential benefits and risks of the procedure, side effects of the proposed treatment, the likelihood of the patient achieving the goals of the procedure, and any potential problems that might occur during the procedure or recuperation. Informed consent has been obtained.  Description of procedure:  The patient was taken to the operating room and general anesthesia was induced. SCDs were placed for DVT prophylaxis. The patient was placed in the dorsal lithotomy position, prepped and draped in the usual sterile fashion, and preoperative antibiotics were administered. A preoperative time-out was performed.   A 21 French rigid cystoscope was used to intubate the urethra and thorough cystoscopy was performed.  The bladder was grossly normal with no suspicious lesions, and the ureteral orifices were orthotopic bilaterally.  A sensor wire was used to intubate the right ureteral orifice and a wire passed easily up to the kidney under fluoroscopic vision.  A semirigid  long ureteroscope was advanced alongside the wire and there was an impacted black calcium oxalate appearing stone in the mid ureter.  A 360 m laser fiber on settings of 1.0 J and 10 Hz was used to methodically dust the stone, and stone fragments were irrigated free from the ureter.  There was moderate erythema in the mid ureter where the stone had been impacted, but I was easily able to advance the scope up into the proximal ureter and no other stone fragments were visualized.  I then advanced the digital single-channel flexible ureteroscope over the wire up to the kidney under fluoroscopic vision.  Thorough pyeloscopy revealed a large 1 cm stone in the mid to upper pole, and another 5 mm stone in the upper pole.  The laser on settings of 0.5 J and 80 Hz was used to methodically dust the stones.  Vision was somewhat limited by stone dust, but thorough pyeloscopy did not show any stone fragments larger than the laser fiber.  A retrograde pyelogram was performed from proximal ureter and showed no extravasation or filling defects.  Pullback ureteroscopy showed no ureteral stones.  A wire was replaced through the scope into the upper pole.  A 6 French by 26 cm ureteral stent was uneventfully placed fluoroscopically with a curl in the upper pole, as well as in the bladder.  The bladder was drained and this concluded the procedure.  Disposition: Stable to PACU  Plan: Schedule stent removal 5/7 or 5/8 in the setting of significantly impacted stone  Legrand Rams, MD

## 2022-06-02 NOTE — Discharge Instructions (Signed)

## 2022-06-02 NOTE — Anesthesia Preprocedure Evaluation (Addendum)
Anesthesia Evaluation  Patient identified by MRN, date of birth, ID band Patient awake    Reviewed: Allergy & Precautions, NPO status , Patient's Chart, lab work & pertinent test results  History of Anesthesia Complications Negative for: history of anesthetic complications  Airway Mallampati: II  TM Distance: >3 FB Neck ROM: Full    Dental no notable dental hx. (+) Teeth Intact   Pulmonary neg pulmonary ROS, neg sleep apnea, neg COPD, Patient abstained from smoking.Not current smoker   Pulmonary exam normal breath sounds clear to auscultation       Cardiovascular Exercise Tolerance: Good METS(-) hypertension(-) CAD and (-) Past MI (-) dysrhythmias + Valvular Problems/Murmurs MR  Rhythm:Regular Rate:Normal - Systolic murmurs Unremarkable stress test in 2021. TTE 2021: INTERPRETATION  NORMAL LEFT VENTRICULAR SYSTOLIC FUNCTION   WITH MILD LVH  NORMAL RIGHT VENTRICULAR SYSTOLIC FUNCTION  MODERATE VALVULAR REGURGITATION (See above)  NO VALVULAR STENOSIS  MODERATE MR  MILD TR, PR  EF 55%     Neuro/Psych negative neurological ROS  negative psych ROS   GI/Hepatic ,neg GERD  ,,(+)     (-) substance abuse    Endo/Other  neg diabetes    Renal/GU Renal disease     Musculoskeletal   Abdominal   Peds  Hematology   Anesthesia Other Findings Past Medical History: No date: Actinic keratosis 06/19/2013: Aftercare following joint replacement 11/05/2014: Chest pain with high risk for cardiac etiology 12/01/2014: Heart palpitations No date: Kidney stones 07/07/2014: Pure hypercholesterolemia 04/14/2014: S/P left unicompartmental knee replacement No date: Skin cancer 08/26/2012: 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                Left pretibial mid. 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 Right lower leg 07/23/2012: Squamous cell carcinoma of right lower leg     Comment:  Well differentiated SCC with superficial infiltration.               Right pretibial 02/27/2019: Squamous cell carcinoma of right lower leg     Comment:  SCCis, hypertrophic. Right distal lateral pretibial. 02/27/2019: Squamous cell carcinoma of right lower leg     Comment:  Well differentiated SCC. Right distal medial pretibial. 10/01/2006: Squamous cell carcinoma of skin     Comment:  Well differentiated SCC with superficial infiltration.               Inferior lateral pretibial. 04/10/2019: Squamous cell carcinoma of skin     Comment:  Well differentiated SCC with superficial infiltration.               Left distal anterior thigh. 07/17/2019: Squamous cell carcinoma of skin     Comment:  Left dorsum hand. WD SCC, deep margin involved. 06/06/2021: Squamous cell carcinoma of skin     Comment:  Right Anterior Pretibial, EDC 02/09/2022: Squamous cell carcinoma of skin     Comment:  Left mid medial pretibial - EDC  Reproductive/Obstetrics  Anesthesia Physical Anesthesia Plan  ASA: 2  Anesthesia Plan: General   Post-op Pain Management: Ofirmev IV (intra-op)*   Induction: Intravenous  PONV Risk Score and Plan: 3 and Ondansetron and Dexamethasone  Airway Management Planned: Oral ETT  Additional Equipment: None  Intra-op Plan:   Post-operative Plan: Extubation in OR  Informed Consent: I have reviewed the patients History and Physical, chart, labs and discussed the procedure including the risks, benefits and alternatives for the proposed anesthesia with the patient or authorized  representative who has indicated his/her understanding and acceptance.     Dental advisory given  Plan Discussed with: CRNA and Surgeon  Anesthesia Plan Comments: (Discussed risks of anesthesia with patient, including PONV, sore throat, post operative cognitive dysfunction, lip/dental/eye damage. Rare risks discussed as well, such as cardiorespiratory and neurological sequelae, and allergic reactions. Discussed the role of CRNA in patient's perioperative care. Patient understands.)       Anesthesia Quick Evaluation

## 2022-06-02 NOTE — Interval H&P Note (Signed)
UROLOGY H&P UPDATE  Agree with prior H&P dated 05/30/2022 by Carman Ching.  89 year old female with hematuria, right-sided flank pain, nausea, CT shows an 8 mm right distal ureteral stone with multiple other right renal stones.  Cardiac: RRR Lungs: CTA bilaterally  Laterality: Right Procedure: Right ureteroscopy, laser lithotripsy, stent placement  Urine: Culture 4/23 <10k mixed flora  We specifically discussed the risks ureteroscopy including bleeding, infection/sepsis, stent related symptoms including flank pain/urgency/frequency/incontinence/dysuria, ureteral injury, inability to access stone, or need for staged or additional procedures.   Sondra Come, MD 06/02/2022

## 2022-06-03 LAB — CULTURE, URINE COMPREHENSIVE

## 2022-06-04 NOTE — Anesthesia Postprocedure Evaluation (Signed)
Anesthesia Post Note  Patient: Tiffany Cole  Procedure(s) Performed: CYSTOSCOPY/URETEROSCOPY/HOLMIUM LASER/STENT PLACEMENT (Right)  Patient location during evaluation: PACU Anesthesia Type: General Level of consciousness: awake and alert Pain management: pain level controlled Vital Signs Assessment: post-procedure vital signs reviewed and stable Respiratory status: spontaneous breathing, nonlabored ventilation, respiratory function stable and patient connected to nasal cannula oxygen Cardiovascular status: blood pressure returned to baseline and stable Postop Assessment: no apparent nausea or vomiting Anesthetic complications: no   No notable events documented.   Last Vitals:  Vitals:   06/02/22 1539 06/02/22 1546  BP: (!) 162/67 (!) 178/71  Pulse: (!) 59   Resp:    Temp:    SpO2: 97% 97%    Last Pain:  Vitals:   06/03/22 1510  TempSrc:   PainSc: 0-No pain                 Corinda Gubler

## 2022-06-05 ENCOUNTER — Encounter: Payer: Self-pay | Admitting: Urology

## 2022-06-14 ENCOUNTER — Encounter: Payer: Self-pay | Admitting: Urology

## 2022-06-14 ENCOUNTER — Ambulatory Visit (INDEPENDENT_AMBULATORY_CARE_PROVIDER_SITE_OTHER): Payer: Medicare Other | Admitting: Urology

## 2022-06-14 VITALS — BP 133/79

## 2022-06-14 DIAGNOSIS — N200 Calculus of kidney: Secondary | ICD-10-CM

## 2022-06-14 DIAGNOSIS — Z466 Encounter for fitting and adjustment of urinary device: Secondary | ICD-10-CM

## 2022-06-14 MED ORDER — CEPHALEXIN 250 MG PO CAPS
500.0000 mg | ORAL_CAPSULE | Freq: Once | ORAL | Status: AC
Start: 2022-06-14 — End: 2022-06-14
  Administered 2022-06-14: 500 mg via ORAL

## 2022-06-14 MED ORDER — CEPHALEXIN 500 MG PO CAPS: 500.0000 mg | ORAL_CAPSULE | Freq: Two times a day (BID) | ORAL | 0 refills | Status: DC

## 2022-06-14 NOTE — Progress Notes (Unsigned)
Cystoscopy Procedure Note:  Indication: Stent removal s/p 06/02/2022 right ureteroscopy for impacted right mid ureteral stone and multiple renal stones  Keflex given for prophylaxis, will give 3-day course with prolonged stent duration.  She cannot give UA today.  After informed consent and discussion of the procedure and its risks, Tiffany Cole was positioned and prepped in the standard fashion. Cystoscopy was performed with a flexible cystoscope. The stent was grasped with flexible graspers and removed in its entirety. The patient tolerated the procedure well.  Findings: Uncomplicated stent removal  Assessment and Plan: RTC 6 months KUB prior  Sondra Come, MD 06/14/2022

## 2022-07-20 DIAGNOSIS — Z87442 Personal history of urinary calculi: Secondary | ICD-10-CM | POA: Insufficient documentation

## 2022-12-15 ENCOUNTER — Ambulatory Visit: Payer: Medicare Other | Admitting: Urology

## 2023-08-29 DIAGNOSIS — F411 Generalized anxiety disorder: Secondary | ICD-10-CM | POA: Insufficient documentation

## 2023-09-12 ENCOUNTER — Ambulatory Visit (INDEPENDENT_AMBULATORY_CARE_PROVIDER_SITE_OTHER): Admitting: Dermatology

## 2023-09-12 ENCOUNTER — Ambulatory Visit: Admitting: Dermatology

## 2023-09-12 DIAGNOSIS — L578 Other skin changes due to chronic exposure to nonionizing radiation: Secondary | ICD-10-CM

## 2023-09-12 DIAGNOSIS — W908XXA Exposure to other nonionizing radiation, initial encounter: Secondary | ICD-10-CM

## 2023-09-12 DIAGNOSIS — D489 Neoplasm of uncertain behavior, unspecified: Secondary | ICD-10-CM

## 2023-09-12 DIAGNOSIS — C44722 Squamous cell carcinoma of skin of right lower limb, including hip: Secondary | ICD-10-CM | POA: Diagnosis not present

## 2023-09-12 DIAGNOSIS — L820 Inflamed seborrheic keratosis: Secondary | ICD-10-CM | POA: Diagnosis not present

## 2023-09-12 DIAGNOSIS — Z8589 Personal history of malignant neoplasm of other organs and systems: Secondary | ICD-10-CM

## 2023-09-12 DIAGNOSIS — C4492 Squamous cell carcinoma of skin, unspecified: Secondary | ICD-10-CM

## 2023-09-12 DIAGNOSIS — D485 Neoplasm of uncertain behavior of skin: Secondary | ICD-10-CM | POA: Diagnosis not present

## 2023-09-12 DIAGNOSIS — D492 Neoplasm of unspecified behavior of bone, soft tissue, and skin: Secondary | ICD-10-CM

## 2023-09-12 DIAGNOSIS — L821 Other seborrheic keratosis: Secondary | ICD-10-CM

## 2023-09-12 HISTORY — DX: Squamous cell carcinoma of skin, unspecified: C44.92

## 2023-09-12 MED ORDER — MUPIROCIN 2 % EX OINT
TOPICAL_OINTMENT | CUTANEOUS | 11 refills | Status: AC
Start: 2023-09-12 — End: ?

## 2023-09-12 NOTE — Progress Notes (Signed)
 Follow-Up Visit   Subjective  Tiffany Cole is a 89 y.o. female who presents for the following: concerned about a spot at right lower leg that has been 4 weeks. Will not go away  The patient has spots, moles and lesions to be evaluated, some may be new or changing and the patient may have concern these could be cancer.  The following portions of the chart were reviewed this encounter and updated as appropriate: medications, allergies, medical history  Review of Systems:  No other skin or systemic complaints except as noted in HPI or Assessment and Plan.  Objective  Well appearing patient in no apparent distress; mood and affect are within normal limits.  A focused examination was performed of the following areas: Right leg , left cheek   Relevant exam findings are noted in the Assessment and Plan.  Right Lower Leg - Anterior 1.6 cm red crusted papule   left cheek x 1 Erythematous stuck-on, waxy papule or plaque  Assessment & Plan   SEBORRHEIC KERATOSIS - Stuck-on, waxy, tan-brown papules and/or plaques  - Benign-appearing - Discussed benign etiology and prognosis. - Observe - Call for any changes  ACTINIC DAMAGE - chronic, secondary to cumulative UV radiation exposure/sun exposure over time - diffuse scaly erythematous macules with underlying dyspigmentation - Recommend daily broad spectrum sunscreen SPF 30+ to sun-exposed areas, reapply every 2 hours as needed.  - Recommend staying in the shade or wearing long sleeves, sun glasses (UVA+UVB protection) and wide brim hats (4-inch brim around the entire circumference of the hat). - Call for new or changing lesions.  HISTORY OF SQUAMOUS CELL CARCINOMA OF THE SKIN Multiple sites see history  - No evidence of recurrence today - No lymphadenopathy - Recommend regular full body skin exams - Recommend daily broad spectrum sunscreen SPF 30+ to sun-exposed areas, reapply every 2 hours as needed.  - Call if any new or changing  lesions are noted between office visits  NEOPLASM OF SKIN   Related Medications mupirocin  ointment (BACTROBAN ) 2 % Apply to skin qd-bid to wounds as needed until healed NEOPLASM OF UNCERTAIN BEHAVIOR Right Lower Leg - Anterior Epidermal / dermal shaving  Lesion diameter (cm):  1.6 Informed consent: discussed and consent obtained   Timeout: patient name, date of birth, surgical site, and procedure verified   Procedure prep:  Patient was prepped and draped in usual sterile fashion Prep type:  Isopropyl alcohol Anesthesia: the lesion was anesthetized in a standard fashion   Anesthetic:  1% lidocaine  w/ epinephrine 1-100,000 buffered w/ 8.4% NaHCO3 Instrument used: flexible razor blade   Hemostasis achieved with: pressure, aluminum chloride and electrodesiccation   Outcome: patient tolerated procedure well   Post-procedure details: sterile dressing applied and wound care instructions given   Dressing type: bandage and petrolatum    Destruction of lesion Complexity: extensive   Destruction method: electrodesiccation and curettage   Informed consent: discussed and consent obtained   Timeout:  patient name, date of birth, surgical site, and procedure verified Procedure prep:  Patient was prepped and draped in usual sterile fashion Prep type:  Isopropyl alcohol Anesthesia: the lesion was anesthetized in a standard fashion   Anesthetic:  1% lidocaine  w/ epinephrine 1-100,000 buffered w/ 8.4% NaHCO3 Curettage performed in three different directions: Yes   Electrodesiccation performed over the curetted area: Yes   Lesion length (cm):  1.6 Lesion width (cm):  1.6 Margin per side (cm):  0.2 Final wound size (cm):  2 Hemostasis achieved with:  pressure, aluminum chloride and electrodesiccation Outcome: patient tolerated procedure well with no complications   Post-procedure details: sterile dressing applied and wound care instructions given   Dressing type: bandage and petrolatum     Specimen 1 - Surgical pathology Differential Diagnosis: r/o scc ED&C done today  Check Margins: No R/o scc  ED&C done today  INFLAMED SEBORRHEIC KERATOSIS left cheek x 1 Symptomatic, irritating, patient would like treated. Destruction of lesion - left cheek x 1 Complexity: simple   Destruction method: cryotherapy   Informed consent: discussed and consent obtained   Timeout:  patient name, date of birth, surgical site, and procedure verified Lesion destroyed using liquid nitrogen: Yes   Region frozen until ice ball extended beyond lesion: Yes   Outcome: patient tolerated procedure well with no complications   Post-procedure details: wound care instructions given    SEBORRHEIC KERATOSIS   ACTINIC SKIN DAMAGE   HISTORY OF SQUAMOUS CELL CARCINOMA    Return if symptoms worsen or fail to improve.  IEleanor Blush, CMA, am acting as scribe for Alm Rhyme, MD.   Documentation: I have reviewed the above documentation for accuracy and completeness, and I agree with the above.  Alm Rhyme, MD

## 2023-09-12 NOTE — Patient Instructions (Addendum)
 Electrodesiccation and Curettage ("Scrape and Burn") Wound Care Instructions  Leave the original bandage on for 24 hours if possible.  If the bandage becomes soaked or soiled before that time, it is OK to remove it and examine the wound.  A small amount of post-operative bleeding is normal.  If excessive bleeding occurs, remove the bandage, place gauze over the site and apply continuous pressure (no peeking) over the area for 30 minutes. If this does not work, please call our clinic as soon as possible or page your doctor if it is after hours.   Once a day, cleanse the wound with soap and water. It is fine to shower. If a thick crust develops you may use a Q-tip dipped into dilute hydrogen peroxide (mix 1:1 with water) to dissolve it.  Hydrogen peroxide can slow the healing process, so use it only as needed.    After washing, apply petroleum jelly (Vaseline) or an antibiotic ointment if your doctor prescribed one for you, followed by a bandage.    For best healing, the wound should be covered with a layer of ointment at all times. If you are not able to keep the area covered with a bandage to hold the ointment in place, this may mean re-applying the ointment several times a day.  Continue this wound care until the wound has healed and is no longer open. It may take several weeks for the wound to heal and close.  Itching and mild discomfort is normal during the healing process.  If you have any discomfort, you can take Tylenol  (acetaminophen ) or ibuprofen as directed on the bottle. (Please do not take these if you have an allergy to them or cannot take them for another reason).  Some redness, tenderness and white or yellow material in the wound is normal healing.  If the area becomes very sore and red, or develops a thick yellow-green material (pus), it may be infected; please notify us .    Wound healing continues for up to one year following surgery. It is not unusual to experience pain in  the scar from time to time during the interval.  If the pain becomes severe or the scar thickens, you should notify the office.    A slight amount of redness in a scar is expected for the first six months.  After six months, the redness will fade and the scar will soften and fade.  The color difference becomes less noticeable with time.  If there are any problems, return for a post-op surgery check at your earliest convenience.  To improve the appearance of the scar, you can use silicone scar gel, cream, or sheets (such as Mederma or Serica) every night for up to one year. These are available over the counter (without a prescription).  Please call our office at [phone] for any questions or concerns.   Biopsy Wound Care Instructions  Leave the original bandage on for 24 hours if possible.  If the bandage becomes soaked or soiled before that time, it is OK to remove it and examine the wound.  A small amount of post-operative bleeding is normal.  If excessive bleeding occurs, remove the bandage, place gauze over the site and apply continuous pressure (no peeking) over the area for 30 minutes. If this does not work, please call our clinic as soon as possible or page your doctor if it is after hours.   Once a day, cleanse the wound with soap and water. It is  fine to shower. If a thick crust develops you may use a Q-tip dipped into dilute hydrogen peroxide (mix 1:1 with water) to dissolve it.  Hydrogen peroxide can slow the healing process, so use it only as needed.    After washing, apply petroleum jelly (Vaseline) or an antibiotic ointment if your doctor prescribed one for you, followed by a bandage.    For best healing, the wound should be covered with a layer of ointment at all times. If you are not able to keep the area covered with a bandage to hold the ointment in place, this may mean re-applying the ointment several times a day.  Continue this wound care until the wound has healed and is no  longer open.   Itching and mild discomfort is normal during the healing process. However, if you develop pain or severe itching, please call our office.   If you have any discomfort, you can take Tylenol  (acetaminophen ) or ibuprofen as directed on the bottle. (Please do not take these if you have an allergy to them or cannot take them for another reason).  Some redness, tenderness and white or yellow material in the wound is normal healing.  If the area becomes very sore and red, or develops a thick yellow-green material (pus), it may be infected; please notify us .    If you have stitches, return to clinic as directed to have the stitches removed. You will continue wound care for 2-3 days after the stitches are removed.   Wound healing continues for up to one year following surgery. It is not unusual to experience pain in the scar from time to time during the interval.  If the pain becomes severe or the scar thickens, you should notify the office.    A slight amount of redness in a scar is expected for the first six months.  After six months, the redness will fade and the scar will soften and fade.  The color difference becomes less noticeable with time.  If there are any problems, return for a post-op surgery check at your earliest convenience.  To improve the appearance of the scar, you can use silicone scar gel, cream, or sheets (such as Mederma or Serica) every night for up to one year. These are available over the counter (without a prescription).  Please call our office at [phone] for any questions or concerns.  Due to recent changes in healthcare laws, you may see results of your pathology and/or laboratory studies on MyChart before the doctors have had a chance to review them. We understand that in some cases there may be results that are confusing or concerning to you. Please understand that not all results are received at the same time and often the doctors may need to interpret  multiple results in order to provide you with the best plan of care or course of treatment. Therefore, we ask that you please give us  2 business days to thoroughly review all your results before contacting the office for clarification. Should we see a critical lab result, you will be contacted sooner.     Seborrheic Keratosis  What causes seborrheic keratoses? Seborrheic keratoses are harmless, common skin growths that first appear during adult life.  As time goes by, more growths appear.  Some people may develop a large number of them.  Seborrheic keratoses appear on both covered and uncovered body parts.  They are not caused by sunlight.  The tendency to develop seborrheic keratoses can be inherited.  They vary in color from skin-colored to gray, brown, or even black.  They can be either smooth or have a rough, warty surface.   Seborrheic keratoses are superficial and look as if they were stuck on the skin.  Under the microscope this type of keratosis looks like layers upon layers of skin.  That is why at times the top layer may seem to fall off, but the rest of the growth remains and re-grows.    Treatment Seborrheic keratoses do not need to be treated, but can easily be removed in the office.  Seborrheic keratoses often cause symptoms when they rub on clothing or jewelry.  Lesions can be in the way of shaving.  If they become inflamed, they can cause itching, soreness, or burning.  Removal of a seborrheic keratosis can be accomplished by freezing, burning, or surgery. If any spot bleeds, scabs, or grows rapidly, please return to have it checked, as these can be an indication of a skin cancer.    Cryotherapy Aftercare  Wash gently with soap and water everyday.   Apply Vaseline and Band-Aid daily until healed.   If You Need Anything After Your Visit  If you have any questions or concerns for your doctor, please call our main line at [phone] and press option 4 to reach your doctor's  medical assistant. If no one answers, please leave a voicemail as directed and we will return your call as soon as possible. Messages left after 4 pm will be answered the following business day.   You may also send us  a message via MyChart. We typically respond to MyChart messages within 1-2 business days.  For prescription refills, please ask your pharmacy to contact our office. Our fax number is [phone].  If you have an urgent issue when the clinic is closed that cannot wait until the next business day, you can page your doctor at the number below.    Please note that while we do our best to be available for urgent issues outside of office hours, we are not available 24/7.   If you have an urgent issue and are unable to reach us , you may choose to seek medical care at your doctor's office, retail clinic, urgent care center, or emergency room.  If you have a medical emergency, please immediately call 911 or go to the emergency department.  Pager Numbers  - Dr. Hester: [phone]  - Dr. Jackquline: [phone]  - Dr. Claudene: [phone]   In the event of inclement weather, please call our main line at [phone] for an update on the status of any delays or closures.  Dermatology Medication Tips: Please keep the boxes that topical medications come in in order to help keep track of the instructions about where and how to use these. Pharmacies typically print the medication instructions only on the boxes and not directly on the medication tubes.   If your medication is too expensive, please contact our office at [phone] option 4 or send us  a message through MyChart.   We are unable to tell what your co-pay for medications will be in advance as this is different depending on your insurance coverage. However, we may be able to find a substitute medication at lower cost or fill out paperwork to get insurance to cover a needed medication.   If a prior authorization is required  to get your medication covered by your insurance company, please allow us  1-2 business days to complete this process.  Drug prices often  vary depending on where the prescription is filled and some pharmacies may offer cheaper prices.  The website www.goodrx.com contains coupons for medications through different pharmacies. The prices here do not account for what the cost may be with help from insurance (it may be cheaper with your insurance), but the website can give you the price if you did not use any insurance.  - You can print the associated coupon and take it with your prescription to the pharmacy.  - You may also stop by our office during regular business hours and pick up a GoodRx coupon card.  - If you need your prescription sent electronically to a different pharmacy, notify our office through [facility] or by phone at [phone] option 4.     Si Usted Necesita Algo Despus de Su Visita  Tambin puede enviarnos un mensaje a travs de Clinical cytogeneticist. Por lo general respondemos a los mensajes de MyChart en el transcurso de 1 a 2 das hbiles.  Para renovar recetas, por favor pida a su farmacia que se ponga en contacto con nuestra oficina. Randi lakes de fax es [place] [phone].  Si tiene un asunto urgente cuando la clnica est cerrada y que no puede esperar hasta el siguiente da hbil, puede llamar/localizar a su doctor(a) al nmero que aparece a continuacin.   Por favor, tenga en cuenta que aunque hacemos todo lo posible para estar disponibles para asuntos urgentes fuera del horario de [place], no estamos disponibles las 24 horas del da, los 7 [address]  [address] de la [place].   Si tiene un problema urgente y no puede comunicarse con nosotros, puede optar por buscar atencin mdica  en el consultorio de su doctor(a), en una clnica privada, en un centro de atencin urgente o en una sala de emergencias.  Si tiene Engineer, drilling, por favor llame inmediatamente al 911 o vaya a la sala  de emergencias.  Nmeros de bper  - Dr. Hester: [phone]  - Dra. Jackquline: [phone]  - Dr. Claudene: [phone]   En caso de inclemencias del tiempo, por favor llame a landry capes principal al [phone] para una actualizacin sobre el [place] de cualquier retraso o cierre.  Consejos para la medicacin en dermatologa: Por favor, guarde las cajas en las que vienen los medicamentos de uso tpico para ayudarle a seguir las instrucciones sobre dnde y cmo usarlos. Las farmacias generalmente imprimen las instrucciones del medicamento slo en las cajas y no directamente en los tubos del [place].   Si su medicamento es muy caro, por favor, pngase en contacto con landry rieger llamando al [phone] y presione la opcin 4 o envenos un mensaje a travs de Clinical cytogeneticist.   No podemos decirle cul ser su copago por los medicamentos por adelantado ya que esto es diferente dependiendo de la cobertura de su seguro. Sin embargo, es posible que podamos encontrar un medicamento sustituto a Audiological scientist un formulario para que el seguro cubra el medicamento que se considera necesario.   Si se requiere una autorizacin previa para que su compaa de seguros malta su medicamento, por favor permtanos de 1 a 2 das hbiles para completar este proceso.  Los precios de los medicamentos varan con frecuencia dependiendo del Environmental consultant de dnde se surte la receta y alguna farmacias pueden ofrecer precios ms baratos.  El sitio web www.goodrx.com tiene cupones para medicamentos de Health and safety inspector. Los precios aqu no tienen en cuenta lo que podra costar con la ayuda del seguro (puede ser ms barato con su  seguro), pero el sitio web puede darle el precio si no Visual merchandiser.  - Puede imprimir el cupn correspondiente y llevarlo con su receta a la farmacia.  - Tambin puede pasar por nuestra oficina durante el horario de atencin regular y Education officer, museum una tarjeta de cupones de GoodRx.  -  Si necesita que su receta se enve electrnicamente a una farmacia diferente, informe a nuestra oficina a travs de MyChart de [place] o por telfono llamando al [phone] y presione la opcin 4.

## 2023-09-13 ENCOUNTER — Encounter: Payer: Self-pay | Admitting: Dermatology

## 2023-09-19 LAB — SURGICAL PATHOLOGY

## 2023-09-20 ENCOUNTER — Encounter: Payer: Self-pay | Admitting: Dermatology

## 2023-09-20 ENCOUNTER — Ambulatory Visit: Payer: Self-pay | Admitting: Dermatology

## 2023-09-20 NOTE — Telephone Encounter (Addendum)
 Called and discussed bx results with patient. She verbalized understanding and denied further questions. Offered to scheduled patient for 6 month follow up but patient declined states she will call back to schedule follow up appointment when she needs and if she notices anything changing.   ----- Message from Alm Rhyme sent at 09/20/2023  9:37 AM EDT ----- FINAL DIAGNOSIS        1. Skin, right lower leg - anterior :       WELL DIFFERENTIATED SQUAMOUS CELL CARCINOMA   Cancer = SCC Well differentiated Already treated Recheck next visit ----- Message ----- From: Interface, Lab In [place] Sent: 09/19/2023   6:09 PM EDT To: Alm JAYSON Rhyme, MD

## 2023-10-12 DIAGNOSIS — M1612 Unilateral primary osteoarthritis, left hip: Principal | ICD-10-CM | POA: Insufficient documentation

## 2023-10-17 DIAGNOSIS — C4492 Squamous cell carcinoma of skin, unspecified: Secondary | ICD-10-CM | POA: Insufficient documentation

## 2023-11-19 NOTE — Discharge Instructions (Signed)
 Instructions after Total Hip Replacement   Tanvi Gatling P. Angie Fava., M.D.    Dept. of Orthopaedics & Sports Medicine [facility] [address] [place], [place]  [address]  Phone: [phone]   Fax: [phone]        [email or web address]        DIET: Drink plenty of non-alcoholic fluids. Resume your normal diet. Include foods high in fiber.  ACTIVITY:  You may use crutches or a walker with weight-bearing as tolerated, unless instructed otherwise. You may be weaned off of the walker or crutches by your Physical Therapist.  Do NOT reach below the level of your knees or cross your legs until allowed.    Continue doing gentle exercises. Exercising will reduce the pain and swelling, increase motion, and prevent muscle weakness.   Please continue to use the TED compression stockings for 6 weeks. You may remove the stockings at night, but should reapply them in the morning. Do not drive or operate any equipment until instructed.  WOUND CARE:  Continue to use ice packs periodically to reduce pain and swelling. The initial dressing (Aquacel) can remain in place for 7 days (see separate instructions). Keep the incision clean and dry. You may bathe or shower after the staples are removed at the first office visit following surgery.  MEDICATIONS: You may resume your regular medications. Please take the pain medication as prescribed on the medication. Do not take pain medication on an empty stomach. Unless instructed otherwise, you should take an enteric-coated aspirin 81 mg. TWICE a day. (This along with elevation will help reduce the possibility of blood clots/phlebitis in your operated leg.) Use a stool softener (such as Senokot-S or Colace) daily and a laxative (such as Miralax or Dulcolax) as needed to prevent constipation.  Do not drive or drink alcoholic beverages when taking pain medications.  CALL THE OFFICE FOR: Temperature above 101 degrees Excessive bleeding or drainage  on the dressing. Excessive swelling, coldness, or paleness of the toes. Persistent nausea and vomiting.  FOLLOW-UP:  You should have an appointment to return to the office in 6 weeks after surgery. Arrangements have been made for continuation of Physical Therapy (either home therapy or outpatient therapy).     [facility] Department Directory         [email or web address]       [email or web address]          Cardiology  Appointments: [place] - [phone]  Endocrinology  Appointments: [place] [phone] [place] - [phone]  Gastroenterology  Appointments: [place] [phone] [place] - [phone]        General Surgery   Appointments: [facility]  Internal Medicine/Family Medicine  Appointments: [facility] [place] - [phone] [place] - [phone]  Metabolic and Weigh Loss Surgery  Appointments: [facility]        Neurology  Appointments: [place] [phone] [place] - [phone]  Neurosurgery  Appointments: [place]  Obstetrics & Gynecology  Appointments: [place] [phone] [place] - [phone]        Pediatrics  Appointments: Sherrie Sport [phone] [place] - [phone]  Physiatry  Appointments: [place] [phone]  Physical Therapy  Appointments: [place] - [phone]        Podiatry  Appointments: [place] [phone] [place] - [phone]  Pulmonology  Appointments: [place]  Rheumatology  Appointments: [place] [phone]        [place] Location: [facility]  [address] [place], [place]  [address]  [place] Sport  Location: [facility]. [address] [place], [place]  [address]  [place] Location: [facility] [address] [place], [place]  [address]

## 2023-11-26 ENCOUNTER — Other Ambulatory Visit: Payer: Self-pay

## 2023-11-26 ENCOUNTER — Encounter
Admission: RE | Admit: 2023-11-26 | Discharge: 2023-11-26 | Disposition: A | Discharge status: Home / Self Care | Source: Ambulatory Visit | Attending: Orthopedic Surgery | Admitting: Orthopedic Surgery

## 2023-11-26 VITALS — BP 141/59 | HR 71 | Temp 97.9°F | Resp 16 | Ht 65.0 in | Wt 140.6 lb

## 2023-11-26 DIAGNOSIS — R079 Chest pain, unspecified: Secondary | ICD-10-CM | POA: Diagnosis not present

## 2023-11-26 DIAGNOSIS — M1612 Unilateral primary osteoarthritis, left hip: Secondary | ICD-10-CM | POA: Diagnosis not present

## 2023-11-26 DIAGNOSIS — Z01812 Encounter for preprocedural laboratory examination: Secondary | ICD-10-CM | POA: Diagnosis not present

## 2023-11-26 DIAGNOSIS — Z01818 Encounter for other preprocedural examination: Secondary | ICD-10-CM

## 2023-11-26 DIAGNOSIS — R002 Palpitations: Secondary | ICD-10-CM | POA: Insufficient documentation

## 2023-11-26 DIAGNOSIS — Z79899 Other long term (current) drug therapy: Secondary | ICD-10-CM | POA: Insufficient documentation

## 2023-11-26 HISTORY — DX: Osteoarthritis of hip, unspecified: M16.9

## 2023-11-26 HISTORY — DX: Essential (primary) hypertension: I10

## 2023-11-26 HISTORY — DX: Unspecified osteoarthritis, unspecified site: M19.90

## 2023-11-26 LAB — CBC WITH DIFFERENTIAL/PLATELET
Abs Immature Granulocytes: 0.03 K/uL (ref 0.00–0.07)
Basophils Absolute: 0 K/uL (ref 0.0–0.1)
Basophils Relative: 1 %
Eosinophils Absolute: 0.1 K/uL (ref 0.0–0.5)
Eosinophils Relative: 2 %
HCT: 33.9 % — ABNORMAL LOW (ref 36.0–46.0)
Hemoglobin: 10.9 g/dL — ABNORMAL LOW (ref 12.0–15.0)
Immature Granulocytes: 1 %
Lymphocytes Relative: 24 %
Lymphs Abs: 1.5 K/uL (ref 0.7–4.0)
MCH: 31.5 pg (ref 26.0–34.0)
MCHC: 32.2 g/dL (ref 30.0–36.0)
MCV: 98 fL (ref 80.0–100.0)
Monocytes Absolute: 0.4 K/uL (ref 0.1–1.0)
Monocytes Relative: 7 %
Neutro Abs: 4.4 K/uL (ref 1.7–7.7)
Neutrophils Relative %: 65 %
Platelets: 242 K/uL (ref 150–400)
RBC: 3.46 MIL/uL — ABNORMAL LOW (ref 3.87–5.11)
RDW: 12.9 % (ref 11.5–15.5)
WBC: 6.5 K/uL (ref 4.0–10.5)
nRBC: 0 % (ref 0.0–0.2)

## 2023-11-26 LAB — COMPREHENSIVE METABOLIC PANEL WITH GFR
ALT: 12 U/L (ref 0–44)
AST: 24 U/L (ref 15–41)
Albumin: 3.6 g/dL (ref 3.5–5.0)
Alkaline Phosphatase: 52 U/L (ref 38–126)
Anion gap: 13 (ref 5–15)
BUN: 24 mg/dL — ABNORMAL HIGH (ref 8–23)
CO2: 27 mmol/L (ref 22–32)
Calcium: 9 mg/dL (ref 8.9–10.3)
Chloride: 102 mmol/L (ref 98–111)
Creatinine, Ser: 0.8 mg/dL (ref 0.44–1.00)
GFR, Estimated: >60 mL/min (ref >60)
Glucose, Bld: 96 mg/dL (ref 70–99)
Potassium: 3.9 mmol/L (ref 3.5–5.1)
Sodium: 142 mmol/L (ref 135–145)
Total Bilirubin: 0.3 mg/dL (ref 0.0–1.2)
Total Protein: 6.7 g/dL (ref 6.5–8.1)

## 2023-11-26 LAB — URINALYSIS, ROUTINE W REFLEX MICROSCOPIC
Bacteria, UA: NONE SEEN
Bilirubin Urine: NEGATIVE
Glucose, UA: NEGATIVE mg/dL
Hgb urine dipstick: NEGATIVE
Ketones, ur: NEGATIVE mg/dL
Nitrite: NEGATIVE
Protein, ur: NEGATIVE mg/dL
Specific Gravity, Urine: 1.026 (ref 1.005–1.030)
pH: 5 (ref 5.0–8.0)

## 2023-11-26 LAB — TYPE AND SCREEN
ABO/RH(D): A POS
Antibody Screen: NEGATIVE

## 2023-11-26 LAB — SURGICAL PCR SCREEN
MRSA, PCR: NEGATIVE
Staphylococcus aureus: NEGATIVE

## 2023-11-26 LAB — SEDIMENTATION RATE: Sed Rate: 22 mm/h (ref 0–30)

## 2023-11-26 LAB — C-REACTIVE PROTEIN: CRP: 0.5 mg/dL (ref <1.0)

## 2023-11-26 NOTE — Patient Instructions (Signed)
 Your procedure is scheduled on: [facility] 12/05/23 Report to the Registration Desk on the 1st floor of the [facility]. To find out your arrival time, please call [phone] between 1PM - 3PM on: TUESDAY 12/04/23 If your arrival time is 6:00 am, do not arrive before that time as the Medical Mall entrance doors do not open until 6:00 am.  REMEMBER: Instructions that are not followed completely may result in serious medical risk, up to and including death; or upon the discretion of your surgeon and anesthesiologist your surgery may need to be rescheduled.  Do not eat food after midnight the night before surgery.  No gum chewing or hard candies.  You may however, drink CLEAR liquids up to 2 hours before you are scheduled to arrive for your surgery. Do not drink anything within 2 hours of your scheduled arrival time.  Clear liquids include: - water  - apple juice without pulp - gatorade (not RED colors) - black coffee or tea (Do NOT add milk or creamers to the coffee or tea) Do NOT drink anything that is not on this list.  In addition, your doctor has ordered for you to drink the provided:  Ensure Pre-Surgery Clear Carbohydrate Drink  Drinking this carbohydrate drink up to two hours before surgery helps to reduce insulin resistance and improve patient outcomes. Please complete drinking 2 hours before scheduled arrival time.  One week prior to surgery: Stop Anti-inflammatories (NSAIDS) such as Advil, Aleve, Ibuprofen, Motrin, Naproxen, Naprosyn and Aspirin based products such as Excedrin, Goody's Powder, BC Powder. Stop ANY OVER THE COUNTER supplements until after surgery.  You may however, continue to take Tylenol  if needed for pain up until the day of surgery.  Continue taking all of your other prescription medications up until the day of surgery.  ON THE DAY OF SURGERY ONLY TAKE THESE MEDICATIONS WITH SIPS OF WATER:  amLODipine (NORVASC)  escitalopram (LEXAPRO)   Use inhalers  on the day of surgery and bring to the hospital.  No Alcohol for 24 hours before or after surgery.  No Smoking including e-cigarettes for 24 hours before surgery.  No chewable tobacco products for at least 6 hours before surgery.  No nicotine patches on the day of surgery.  Do not use any recreational drugs for at least a week (preferably 2 weeks) before your surgery.  Please be advised that the combination of cocaine and anesthesia may have negative outcomes, up to and including death. If you test positive for cocaine, your surgery will be cancelled.  On the morning of surgery brush your teeth with toothpaste and water, you may rinse your mouth with mouthwash if you wish. Do not swallow any toothpaste or mouthwash.  Use CHG Soap or wipes as directed on instruction sheet.  Do not wear jewelry, make-up, hairpins, clips or nail polish.  For welded (permanent) jewelry: bracelets, anklets, waist bands, etc.  Please have this removed prior to surgery.  If it is not removed, there is a chance that hospital personnel will need to cut it off on the day of surgery.  Do not wear lotions, powders, or perfumes.   Do not shave body hair from the neck down 48 hours before surgery.  Contact lenses, hearing aids and dentures may not be worn into surgery.  Do not bring valuables to the hospital. [facility] is not responsible for any missing/lost belongings or valuables.   Bring your C-PAP to the hospital in case you may have to spend the night.  Notify your doctor if there is any change in your medical condition (cold, fever, infection).  Wear comfortable clothing (specific to your surgery type) to the hospital.  After surgery, you can help prevent lung complications by doing breathing exercises.  Take deep breaths and cough every 1-2 hours. Your doctor may order a device called an Incentive Spirometer to help you take deep breaths. When coughing or sneezing, hold a pillow firmly against your  incision with both hands. This is called "splinting." Doing this helps protect your incision. It also decreases belly discomfort.  If you are being admitted to the hospital overnight, leave your suitcase in the car. After surgery it may be brought to your room.  In case of increased patient census, it may be necessary for you, the patient, to continue your postoperative care in the Same Day Surgery department.  If you are being discharged the day of surgery, you will not be allowed to drive home. You will need a responsible individual to drive you home and stay with you for 24 hours after surgery.   If you are taking public transportation, you will need to have a responsible individual with you.  Please call the Pre-admissions Testing Dept. at [phone] if you have any questions about these instructions.  Surgery Visitation Policy:  Patients having surgery or a procedure may have two visitors.  Children under the age of 68 must have an adult with them who is not the patient.  Inpatient Visitation:    Visiting hours are 7 a.m. to 8 p.m. Up to four visitors are allowed at one time in a patient room. The visitors may rotate out with other people during the day.  One visitor age 39 or older may stay with the patient overnight and must be in the room by 8 p.m.  Merchandiser, retail to address health-related social needs:  [email or web address]    Pre-operative 4 CHG Bath Instructions   You can play a key role in reducing the risk of infection after surgery. Your skin needs to be as free of germs as possible. You can reduce the number of germs on your skin by washing with CHG (chlorhexidine  gluconate) soap before surgery. CHG is an antiseptic soap that kills germs and continues to kill germs even after washing.   DO NOT use if you have an allergy to chlorhexidine /CHG or antibacterial soaps. If your skin becomes reddened or irritated, stop using the CHG and notify one  of our RNs at [phone].   Please shower with the CHG soap starting 4 days before surgery using the following schedule:     Please keep in mind the following:  DO NOT shave, including legs and underarms, starting the day of your first shower.   You may shave your face at any point before/day of surgery.  Place clean sheets on your bed the day you start using CHG soap. Use a clean washcloth (not used since being washed) for each shower. DO NOT sleep with pets once you start using the CHG.   CHG Shower Instructions:  If you choose to wash your hair and private area, wash first with your normal shampoo/soap.  After you use shampoo/soap, rinse your hair and body thoroughly to remove shampoo/soap residue.  Turn the water OFF and apply about 3 tablespoons (45 ml) of CHG soap to a CLEAN washcloth.  Apply CHG soap ONLY FROM YOUR NECK DOWN TO YOUR TOES (washing for 3-5 minutes)  DO NOT use CHG soap on face,  private areas, open wounds, or sores.  Pay special attention to the area where your surgery is being performed.  If you are having back surgery, having someone wash your back for you may be helpful. Wait 2 minutes after CHG soap is applied, then you may rinse off the CHG soap.  Pat dry with a clean towel  Put on clean clothes/pajamas   If you choose to wear lotion, please use ONLY the CHG-compatible lotions on the back of this paper.     Additional instructions for the day of surgery: DO NOT APPLY any lotions, deodorants, cologne, or perfumes.   Put on clean/comfortable clothes.  Brush your teeth.  Ask your nurse before applying any prescription medications to the skin.      CHG Compatible Lotions   Aveeno Moisturizing lotion  Cetaphil Moisturizing Cream  Cetaphil Moisturizing Lotion  Clairol Herbal Essence Moisturizing Lotion, Dry Skin  Clairol Herbal Essence Moisturizing Lotion, Extra Dry Skin  Clairol Herbal Essence Moisturizing Lotion, Normal Skin  Curel Age Defying  Therapeutic Moisturizing Lotion with Alpha Hydroxy  Curel Extreme Care Body Lotion  Curel Soothing Hands Moisturizing Hand Lotion  Curel Therapeutic Moisturizing Cream, Fragrance-Free  Curel Therapeutic Moisturizing Lotion, Fragrance-Free  Curel Therapeutic Moisturizing Lotion, Original Formula  Eucerin Daily Replenishing Lotion  Eucerin Dry Skin Therapy Plus Alpha Hydroxy Crme  Eucerin Dry Skin Therapy Plus Alpha Hydroxy Lotion  Eucerin Original Crme  Eucerin Original Lotion  Eucerin Plus Crme Eucerin Plus Lotion  Eucerin TriLipid Replenishing Lotion  Keri Anti-Bacterial Hand Lotion  Keri Deep Conditioning Original Lotion Dry Skin Formula Softly Scented  Keri Deep Conditioning Original Lotion, Fragrance Free Sensitive Skin Formula  Keri Lotion Fast Absorbing Fragrance Free Sensitive Skin Formula  Keri Lotion Fast Absorbing Softly Scented Dry Skin Formula  Keri Original Lotion  Keri Skin Renewal Lotion Keri Silky Smooth Lotion  Keri Silky Smooth Sensitive Skin Lotion  Nivea Body Creamy Conditioning Oil  Nivea Body Extra Enriched Lotion  Nivea Body Original Lotion  Nivea Body Sheer Moisturizing Lotion Nivea Crme  Nivea Skin Firming Lotion  NutraDerm 30 Skin Lotion  NutraDerm Skin Lotion  NutraDerm Therapeutic Skin Cream  NutraDerm Therapeutic Skin Lotion  ProShield Protective Hand Cream  Provon moisturizing lotion      Preoperative Educational Videos for Total Hip, Knee and Shoulder Replacements  To better prepare for surgery, please view our videos that explain the physical activity and discharge planning required to have the best surgical recovery at [facility].  [email or web address]  Questions? Call [phone] or email [email or web address]

## 2023-11-28 LAB — URINE CULTURE: Culture: NO GROWTH

## 2023-12-05 DIAGNOSIS — Z01818 Encounter for other preprocedural examination: Secondary | ICD-10-CM

## 2023-12-05 DIAGNOSIS — R079 Chest pain, unspecified: Secondary | ICD-10-CM

## 2023-12-05 DIAGNOSIS — R002 Palpitations: Secondary | ICD-10-CM

## 2023-12-05 DIAGNOSIS — Z01812 Encounter for preprocedural laboratory examination: Secondary | ICD-10-CM

## 2023-12-05 DIAGNOSIS — R8290 Unspecified abnormal findings in urine: Secondary | ICD-10-CM

## 2023-12-05 DIAGNOSIS — R8271 Bacteriuria: Secondary | ICD-10-CM

## 2023-12-05 DIAGNOSIS — M1612 Unilateral primary osteoarthritis, left hip: Secondary | ICD-10-CM

## 2023-12-11 ENCOUNTER — Ambulatory Visit (INDEPENDENT_AMBULATORY_CARE_PROVIDER_SITE_OTHER): Admitting: Dermatology

## 2023-12-11 ENCOUNTER — Encounter: Payer: Self-pay | Admitting: Dermatology

## 2023-12-11 DIAGNOSIS — D485 Neoplasm of uncertain behavior of skin: Secondary | ICD-10-CM

## 2023-12-11 DIAGNOSIS — C44729 Squamous cell carcinoma of skin of left lower limb, including hip: Secondary | ICD-10-CM

## 2023-12-11 DIAGNOSIS — L814 Other melanin hyperpigmentation: Secondary | ICD-10-CM

## 2023-12-11 DIAGNOSIS — W908XXA Exposure to other nonionizing radiation, initial encounter: Secondary | ICD-10-CM | POA: Diagnosis not present

## 2023-12-11 DIAGNOSIS — L578 Other skin changes due to chronic exposure to nonionizing radiation: Secondary | ICD-10-CM | POA: Diagnosis not present

## 2023-12-11 DIAGNOSIS — L570 Actinic keratosis: Secondary | ICD-10-CM | POA: Diagnosis not present

## 2023-12-11 DIAGNOSIS — L821 Other seborrheic keratosis: Secondary | ICD-10-CM

## 2023-12-11 NOTE — Progress Notes (Signed)
 Follow-Up Visit   Subjective  Tiffany Cole is a 89 y.o. female who presents for the following: spot at left lower leg.   The following portions of the chart were reviewed this encounter and updated as appropriate: medications, allergies, medical history  Review of Systems:  No other skin or systemic complaints except as noted in HPI or Assessment and Plan.  Objective  Well appearing patient in no apparent distress; mood and affect are within normal limits.   A focused examination was performed of the following areas: Left leg  Relevant exam findings are noted in the Assessment and Plan.  left lateral lower leg 1.2 cm pink keratotic nodule  L spinal upper back Keratotic papule, hypertrophic  Assessment & Plan     NEOPLASM OF UNCERTAIN BEHAVIOR OF SKIN left lateral lower leg Epidermal / dermal shaving  Lesion diameter (cm):  1.2 Informed consent: discussed and consent obtained   Patient was prepped and draped in usual sterile fashion: Area prepped with alcohol. Anesthesia: the lesion was anesthetized in a standard fashion   Anesthetic:  1% lidocaine  w/ epinephrine 1-100,000 buffered w/ 8.4% NaHCO3 Instrument used: flexible razor blade   Hemostasis achieved with: pressure, aluminum chloride and electrodesiccation   Outcome: patient tolerated procedure well    Destruction of lesion  Destruction method: electrodesiccation and curettage   Informed consent: discussed and consent obtained   Curettage performed in three different directions: Yes   Electrodesiccation performed over the curetted area: Yes   Final wound size (cm):  1.2 Hemostasis achieved with:  pressure, aluminum chloride and electrodesiccation Outcome: patient tolerated procedure well with no complications   Post-procedure details: wound care instructions given   Additional details:  Mupirocin  ointment and Bandaid applied   Specimen 1 - Surgical pathology Differential Diagnosis: r/o SCC  Check  Margins: No EDC AK (ACTINIC KERATOSIS) L spinal upper back Hypertrophic  Actinic keratoses are precancerous spots that appear secondary to cumulative UV radiation exposure/sun exposure over time. They are chronic with expected duration over 1 year. A portion of actinic keratoses will progress to squamous cell carcinoma of the skin. It is not possible to reliably predict which spots will progress to skin cancer and so treatment is recommended to prevent development of skin cancer.  Recommend daily broad spectrum sunscreen SPF 30+ to sun-exposed areas, reapply every 2 hours as needed.  Recommend staying in the shade or wearing long sleeves, sun glasses (UVA+UVB protection) and wide brim hats (4-inch brim around the entire circumference of the hat). Call for new or changing lesions. Destruction of lesion - L spinal upper back  Destruction method: cryotherapy   Informed consent: discussed and consent obtained   Lesion destroyed using liquid nitrogen: Yes   Region frozen until ice ball extended beyond lesion: Yes   Outcome: patient tolerated procedure well with no complications   Post-procedure details: wound care instructions given   Additional details:  Prior to procedure, discussed risks of blister formation, small wound, skin dyspigmentation, or rare scar following cryotherapy. Recommend Vaseline ointment to treated areas while healing.    SEBORRHEIC KERATOSIS - Stuck-on, waxy, tan-brown papules and/or plaques  - Benign-appearing - Discussed benign etiology and prognosis. - Observe - Call for any changes  LENTIGINES Exam: scattered tan macules Due to sun exposure Treatment Plan: Benign-appearing, observe. Recommend daily broad spectrum sunscreen SPF 30+ to sun-exposed areas, reapply every 2 hours as needed.  Call for any changes  ACTINIC DAMAGE - chronic, secondary to cumulative UV radiation exposure/sun exposure  over time - diffuse scaly erythematous macules with underlying  dyspigmentation - Recommend daily broad spectrum sunscreen SPF 30+ to sun-exposed areas, reapply every 2 hours as needed.  - Recommend staying in the shade or wearing long sleeves, sun glasses (UVA+UVB protection) and wide brim hats (4-inch brim around the entire circumference of the hat). - Call for new or changing lesions.  Return if symptoms worsen or fail to improve.  LILLETTE Lonell Drones, RMA, am acting as scribe for Rexene Rattler, MD .   Documentation: I have reviewed the above documentation for accuracy and completeness, and I agree with the above.  Rexene Rattler, MD

## 2023-12-11 NOTE — Patient Instructions (Signed)

## 2023-12-14 LAB — SURGICAL PATHOLOGY

## 2023-12-19 ENCOUNTER — Ambulatory Visit: Payer: Self-pay | Admitting: Dermatology

## 2023-12-19 ENCOUNTER — Encounter: Payer: Self-pay | Admitting: Dermatology

## 2023-12-19 NOTE — Telephone Encounter (Signed)
-----   Message from Rexene Rattler sent at 12/19/2023 10:04 AM EST ----- 1. Skin, left lateral lower leg :       WELL DIFFERENTIATED SQUAMOUS CELL CARCINOMA   SCC skin cancer- already treated with EDC at time of biopsy, recheck area in 3 months  - please call patient ----- Message ----- From: Interface, Lab In Three [facility] One Sent: 12/14/2023   6:17 PM EST To: Rexene Rattler, MD

## 2023-12-19 NOTE — Telephone Encounter (Signed)
 Patient advised of BX results. She has declined 3 month follow up at this time. She is having hip surgery in a few weeks and wants to see how she recovers before scheduling any additional appointments. aw

## 2023-12-19 NOTE — Telephone Encounter (Signed)
 Left pt msg to call for bx results/sh

## 2023-12-24 ENCOUNTER — Encounter
Admission: RE | Admit: 2023-12-24 | Discharge: 2023-12-24 | Disposition: A | Discharge status: Home / Self Care | Source: Ambulatory Visit | Attending: Orthopedic Surgery | Admitting: Orthopedic Surgery

## 2023-12-24 DIAGNOSIS — Z01818 Encounter for other preprocedural examination: Secondary | ICD-10-CM

## 2023-12-24 NOTE — Progress Notes (Signed)
 Surgery instructions reviewed again with pt for upcoming 01-02-24 surgery with Dr Mardee. Pt does not have my chart so pt gave me permission to call her daughter Devere so I can get her email address to email updated surgery instructions

## 2023-12-24 NOTE — Patient Instructions (Signed)
 Your procedure is scheduled on:01-02-24 Wednesday Report to the Registration Desk on the 1st floor of the [facility].Then proceed to the 2nd floor surgery desk To find out your arrival time, please call [phone] between 1PM - 3PM on:01-01-24 Tuesday If your arrival time is 6:00 am, do not arrive before that time as the Medical Mall entrance doors do not open until 6:00 am.  REMEMBER: Instructions that are not followed completely may result in serious medical risk, up to and including death; or upon the discretion of your surgeon and anesthesiologist your surgery may need to be rescheduled.  Do not eat food after midnight the night before surgery.  No gum chewing or hard candies.  You may however, drink CLEAR liquids up to 2 hours before you are scheduled to arrive for your surgery. Do not drink anything within 2 hours of your scheduled arrival time.  Clear liquids include: - water  - apple juice without pulp - gatorade (not RED colors) - black coffee or tea (Do NOT add milk or creamers to the coffee or tea) Do NOT drink anything that is not on this list.  In addition, your doctor has ordered for you to drink the provided:  Ensure Pre-Surgery Clear Carbohydrate Drink Drinking this carbohydrate drink up to two hours before surgery helps to reduce insulin resistance and improve patient outcomes. Please complete drinking 2 hours before scheduled arrival time.  One week prior to surgery:Last dose will be on 12-25-23 Tuesday Stop Anti-inflammatories (NSAIDS) such as Advil, Aleve, Ibuprofen, Motrin, Naproxen, Naprosyn and Aspirin based products such as Excedrin, Goody's Powder, BC Powder. Stop ANY OVER THE COUNTER supplements until after surgery (Multivitamin)  You may however, continue to take Tylenol  if needed for pain up until the day of surgery.  Continue taking all of your other prescription medications up until the day of surgery.  ON THE DAY OF SURGERY ONLY TAKE THESE  MEDICATIONS WITH SIPS OF WATER: -amLODipine (NORVASC)  -escitalopram (LEXAPRO)   No Alcohol for 24 hours before or after surgery.  No Smoking including e-cigarettes for 24 hours before surgery.  No chewable tobacco products for at least 6 hours before surgery.  No nicotine patches on the day of surgery.  Do not use any recreational drugs for at least a week (preferably 2 weeks) before your surgery.  Please be advised that the combination of cocaine and anesthesia may have negative outcomes, up to and including death. If you test positive for cocaine, your surgery will be cancelled.  On the morning of surgery brush your teeth with toothpaste and water, you may rinse your mouth with mouthwash if you wish. Do not swallow any toothpaste or mouthwash.  Use CHG Soap as directed on instruction sheet.  Do not wear jewelry, make-up, hairpins, clips or nail polish.  For welded (permanent) jewelry: bracelets, anklets, waist bands, etc.  Please have this removed prior to surgery.  If it is not removed, there is a chance that hospital personnel will need to cut it off on the day of surgery.  Do not wear lotions, powders, or perfumes.   Do not shave body hair from the neck down 48 hours before surgery.  Contact lenses, hearing aids and dentures may not be worn into surgery.  Do not bring valuables to the hospital. [facility] is not responsible for any missing/lost belongings or valuables.   Notify your doctor if there is any change in your medical condition (cold, fever, infection).  Wear comfortable clothing (specific to  your surgery type) to the hospital.  After surgery, you can help prevent lung complications by doing breathing exercises.  Take deep breaths and cough every 1-2 hours. Your doctor may order a device called an Incentive Spirometer to help you take deep breaths. When coughing or sneezing, hold a pillow firmly against your incision with both hands. This is called "splinting."  Doing this helps protect your incision. It also decreases belly discomfort.  If you are being admitted to the hospital overnight, leave your suitcase in the car. After surgery it may be brought to your room.  In case of increased patient census, it may be necessary for you, the patient, to continue your postoperative care in the Same Day Surgery department.  If you are being discharged the day of surgery, you will not be allowed to drive home. You will need a responsible individual to drive you home and stay with you for 24 hours after surgery.   If you are taking public transportation, you will need to have a responsible individual with you.  Please call the Pre-admissions Testing Dept. at [phone] if you have any questions about these instructions.  Surgery Visitation Policy:  Patients having surgery or a procedure may have two visitors.  Children under the age of 60 must have an adult with them who is not the patient.  Inpatient Visitation:    Visiting hours are 7 a.m. to 8 p.m. Up to four visitors are allowed at one time in a patient room. The visitors may rotate out with other people during the day.  One visitor age 86 or older may stay with the patient overnight and must be in the room by 8 p.m.    Pre-operative 4 CHG Bath Instructions   You can play a key role in reducing the risk of infection after surgery. Your skin needs to be as free of germs as possible. You can reduce the number of germs on your skin by washing with CHG (chlorhexidine  gluconate) soap before surgery. CHG is an antiseptic soap that kills germs and continues to kill germs even after washing.   DO NOT use if you have an allergy to chlorhexidine /CHG or antibacterial soaps. If your skin becomes reddened or irritated, stop using the CHG and notify one of our RNs at [phone].   Please shower with the CHG soap starting 4 days before surgery using the following schedule:     Please keep in mind the  following:  DO NOT shave, including legs and underarms, starting the day of your first shower.   You may shave your face at any point before/day of surgery.  Place clean sheets on your bed the day you start using CHG soap. Use a clean washcloth (not used since being washed) for each shower. DO NOT sleep with pets once you start using the CHG.   CHG Shower Instructions:  If you choose to wash your hair and private area, wash first with your normal shampoo/soap.  After you use shampoo/soap, rinse your hair and body thoroughly to remove shampoo/soap residue.  Turn the water OFF and apply about 3 tablespoons (45 ml) of CHG soap to a CLEAN washcloth.  Apply CHG soap ONLY FROM YOUR NECK DOWN TO YOUR TOES (washing for 3-5 minutes)  DO NOT use CHG soap on face, private areas, open wounds, or sores.  Pay special attention to the area where your surgery is being performed.  If you are having back surgery, having someone wash your back for  you may be helpful. Wait 2 minutes after CHG soap is applied, then you may rinse off the CHG soap.  Pat dry with a clean towel  Put on clean clothes/pajamas   If you choose to wear lotion, please use ONLY the CHG-compatible lotions on the back of this paper.     Additional instructions for the day of surgery: DO NOT APPLY any lotions, deodorants, cologne, or perfumes.   Put on clean/comfortable clothes.  Brush your teeth.  Ask your nurse before applying any prescription medications to the skin.      CHG Compatible Lotions   Aveeno Moisturizing lotion  Cetaphil Moisturizing Cream  Cetaphil Moisturizing Lotion  Clairol Herbal Essence Moisturizing Lotion, Dry Skin  Clairol Herbal Essence Moisturizing Lotion, Extra Dry Skin  Clairol Herbal Essence Moisturizing Lotion, Normal Skin  Curel Age Defying Therapeutic Moisturizing Lotion with Alpha Hydroxy  Curel Extreme Care Body Lotion  Curel Soothing Hands Moisturizing Hand Lotion  Curel Therapeutic  Moisturizing Cream, Fragrance-Free  Curel Therapeutic Moisturizing Lotion, Fragrance-Free  Curel Therapeutic Moisturizing Lotion, Original Formula  Eucerin Daily Replenishing Lotion  Eucerin Dry Skin Therapy Plus Alpha Hydroxy Crme  Eucerin Dry Skin Therapy Plus Alpha Hydroxy Lotion  Eucerin Original Crme  Eucerin Original Lotion  Eucerin Plus Crme Eucerin Plus Lotion  Eucerin TriLipid Replenishing Lotion  Keri Anti-Bacterial Hand Lotion  Keri Deep Conditioning Original Lotion Dry Skin Formula Softly Scented  Keri Deep Conditioning Original Lotion, Fragrance Free Sensitive Skin Formula  Keri Lotion Fast Absorbing Fragrance Free Sensitive Skin Formula  Keri Lotion Fast Absorbing Softly Scented Dry Skin Formula  Keri Original Lotion  Keri Skin Renewal Lotion Keri Silky Smooth Lotion  Keri Silky Smooth Sensitive Skin Lotion  Nivea Body Creamy Conditioning Oil  Nivea Body Extra Enriched Teacher, Adult Education Moisturizing Lotion Nivea Crme  Nivea Skin Firming Lotion  NutraDerm 30 Skin Lotion  NutraDerm Skin Lotion  NutraDerm Therapeutic Skin Cream  NutraDerm Therapeutic Skin Lotion  ProShield Protective Hand Cream  Provon moisturizing lotion   Merchandiser, Retail to address health-related social needs:  [email or web address]

## 2023-12-30 NOTE — H&P (Signed)
 ORTHOPAEDIC HISTORY & PHYSICAL Kip Lynwood Double, [place] - 12/26/2023 10:00 AM EST Formatting of this note is different from the original. Chief Complaint: Chief Complaint Patient presents with Pre-op Exam H&P - LT THA - 11.26.25/JPH   Tiffany Cole is a 89 y.o. female who presents today for her surgical history and physical for upcoming left total hip arthroplasty scheduled with Dr. Mardee on 01/02/2024. The patient denies any recent trauma or injury to the left hip. She denies any changes in her medical history since her last evaluation. She denies any personal history of heart attack, stroke, asthma or COPD. The patient denies any history of DVT. She denies any history of diabetes. She reports a 9 out of 10 pain score in the left hip. She is taking Tylenol  as needed for discomfort at this time.  Past Medical History: Past Medical History: Diagnosis Date Acid reflux disease Arthritis Cataracts, bilateral H/O measles H/O mumps H/O seasonal allergies Heart palpitations 12/01/2014 History of chickenpox History of kidney stones History of skin cancer On left shoulder and right leg/ankle Pure hypercholesterolemia (LDL 148 - 07/07/14) - diet controlled 07/07/2014 Vertigo  Past Surgical History: Past Surgical History: Procedure Laterality Date FRACTURE SURGERY Left 2006 Placed 8 screws and plate for repair of matatarsels Cataract surgery 2007 Nasal surgery for deviated sputum 2011 Right total hip arthroplasty 05/16/2010 Dr. Mardee Left medial unicompartmental knee arthroplasty 03/19/2013 Medial [place], [facility]K APPENDECTOMY ARTHROSCOPY SHOULDER Right TONSILLECTOMY TUBAL LIGATION  Past Family History: Family History Problem Relation Age of Onset High blood pressure (Hypertension) Mother Arthritis Mother Emphysema Father Cancer Brother  Medications: Current Outpatient Medications Medication Sig Dispense Refill amLODIPine  (NORVASC ) 2.5 MG tablet TAKE 1 TABLET BY MOUTH ONCE DAILY  100 tablet 1 cyanocobalamin (VITAMIN B12) 1000 MCG tablet Take 1,000 mcg by mouth once daily diclofenac (VOLTAREN) 1 % topical gel Apply 2 g topically 4 (four) times daily as needed (Patient not taking: Reported on 11/23/2023) dimenhyDRINATE (DRAMAMINE) 25 mg Chew Take 0.5 tablets by mouth as needed erythromycin (ROMYCIN) ophthalmic ointment Place 0.5 inches into the left eye 2 (two) times daily escitalopram  oxalate (LEXAPRO ) 10 MG tablet Take 1 tablet (10 mg total) by mouth once daily 100 tablet 1 ibuprofen (MOTRIN) 800 MG tablet Take 800 mg by mouth every 6 (six) hours as needed for Pain multivitamin tablet Take 1 tablet by mouth once daily  No current facility-administered medications for this visit.  Allergies: Allergies Allergen Reactions Valium  [Diazepam ] Anaphylaxis Stops breathing   Review of Systems: A comprehensive 14 point ROS was performed, reviewed by me today, and the pertinent orthopaedic findings are documented in the HPI.  Exam: BP (!) 166/70  Ht 165.1 cm (5' 5)  Wt 62.1 kg (137 lb)  LMP (LMP Unknown)  BMI 22.80 kg/m General/Constitutional: The patient appears to be well-nourished, well-developed, and in no acute distress. Neuro/Psych: Normal mood and affect, oriented to person, place and time. Eyes: Non-icteric. Pupils are equal, round, and reactive to light, and exhibit synchronous movement. ENT: Unremarkable. Lymphatic: No palpable adenopathy. Respiratory: Lungs clear to auscultation, Normal chest excursion, No wheezes, and Non-labored breathing Cardiovascular: Regular rate and rhythm. No murmurs. and No edema, swelling or tenderness, except as noted in detailed exam. Integumentary: No impressive skin lesions present, except as noted in detailed exam. Musculoskeletal: Unremarkable, except as noted in detailed exam.  Left hip exam Left Hip:  Upon inspection of the patient's left hip, there does not appear to be any noticeable erythema, swelling or gross  deformity.  Pelvic tilt:  Negative Limb lengths: Equal with the patient standing Soft tissue swelling: Negative Erythema: Negative Crepitance: Negative Tenderness: Greater trochanter is nontender to palpation. Mild to moderate pain is elicited by axial compression or extremes of rotation. Atrophy: No atrophy. Fair hip flexor and abductor strength. Range of Motion: EXT/FLEX: -/95 ADD/ABD: -/20 IR/ER: 10/15  Patient is neurovascularly intact to all dermatomes extending down there Left lower extremity to all dermatomes. Posterior tibial pulses were appreciated, 2+.  Imaging: Previous x-rays of the left hip were reviewed at today's appointment. These x-rays demonstrate significant loss of the femoral acetabular joint space with bone-on-bone articulation more along the superior aspect. Subchondral sclerosis is identified. Osteophyte formation is noted. Cystic changes to the femoral head is identified.  Impression: Primary osteoarthritis of left hip [M16.12] Primary osteoarthritis of left hip (primary encounter diagnosis) Chronic left hip pain  Plan: 1. Treatment options were discussed today with the patient. 2. The patient is scheduled to undergo a left total hip arthroplasty with Dr. Mardee on 01/02/2024. 3. The patient was instructed on the risk and benefits of surgical intervention and wishes to proceed at this time. 4. This document will serve as a surgical history of physical for the patient. 5. The patient will follow-up per standard postop protocol. They can call the clinic they have any questions, new symptoms develop or symptoms worsen.  The procedure was discussed with the patient, as were the potential risks (including bleeding, infection, nerve and/or blood vessel injury, persistent or recurrent pain, failure of the hardware, dislocation, leg length inequality need for further surgery, blood clots, strokes, heart attacks and/or arhythmias, pneumonia, etc.) and benefits. The  patient states her understanding and wishes to proceed.  This office visit took 45 minutes, of which >50% involved patient counseling/education.  Review of the [place] CSRS was performed in accordance of the [facility] prior to dispensing any controlled drugs.  This note was generated in part with voice recognition software and I apologize for any typographical errors that were not detected and corrected.  DOROTHA Gustavo Level, PA-C, CAQ-OS [facility] Electronically signed by Level Lynwood Gustavo, PA at 12/26/2023 2:42 PM EST

## 2024-01-02 ENCOUNTER — Encounter
Admission: RE | Disposition: A | Discharge status: Home / Self Care | Payer: Self-pay | Source: Home / Self Care | Attending: Orthopedic Surgery

## 2024-01-02 ENCOUNTER — Ambulatory Visit: Payer: Self-pay | Admitting: Urgent Care

## 2024-01-02 ENCOUNTER — Other Ambulatory Visit: Payer: Self-pay

## 2024-01-02 ENCOUNTER — Observation Stay

## 2024-01-02 ENCOUNTER — Encounter: Payer: Self-pay | Admitting: Orthopedic Surgery

## 2024-01-02 ENCOUNTER — Observation Stay
Admission: RE | Admit: 2024-01-02 | Discharge: 2024-01-03 | Disposition: A | Discharge status: Home / Self Care | Attending: Orthopedic Surgery | Admitting: Orthopedic Surgery

## 2024-01-02 DIAGNOSIS — Z96652 Presence of left artificial knee joint: Secondary | ICD-10-CM | POA: Insufficient documentation

## 2024-01-02 DIAGNOSIS — R002 Palpitations: Secondary | ICD-10-CM

## 2024-01-02 DIAGNOSIS — Z01818 Encounter for other preprocedural examination: Secondary | ICD-10-CM

## 2024-01-02 DIAGNOSIS — Z96642 Presence of left artificial hip joint: Secondary | ICD-10-CM

## 2024-01-02 DIAGNOSIS — Z79899 Other long term (current) drug therapy: Secondary | ICD-10-CM | POA: Insufficient documentation

## 2024-01-02 DIAGNOSIS — I10 Essential (primary) hypertension: Secondary | ICD-10-CM | POA: Insufficient documentation

## 2024-01-02 DIAGNOSIS — Z7982 Long term (current) use of aspirin: Secondary | ICD-10-CM | POA: Diagnosis not present

## 2024-01-02 DIAGNOSIS — R8290 Unspecified abnormal findings in urine: Secondary | ICD-10-CM

## 2024-01-02 DIAGNOSIS — Z85828 Personal history of other malignant neoplasm of skin: Secondary | ICD-10-CM | POA: Insufficient documentation

## 2024-01-02 DIAGNOSIS — Z01812 Encounter for preprocedural laboratory examination: Secondary | ICD-10-CM

## 2024-01-02 DIAGNOSIS — Z96641 Presence of right artificial hip joint: Secondary | ICD-10-CM | POA: Diagnosis not present

## 2024-01-02 DIAGNOSIS — R079 Chest pain, unspecified: Secondary | ICD-10-CM

## 2024-01-02 DIAGNOSIS — M1612 Unilateral primary osteoarthritis, left hip: Secondary | ICD-10-CM | POA: Diagnosis present

## 2024-01-02 DIAGNOSIS — R8271 Bacteriuria: Secondary | ICD-10-CM

## 2024-01-02 HISTORY — PX: TOTAL HIP ARTHROPLASTY: SHX124

## 2024-01-02 LAB — TYPE AND SCREEN
ABO/RH(D): A POS
Antibody Screen: NEGATIVE

## 2024-01-02 SURGERY — ARTHROPLASTY, HIP, TOTAL,POSTERIOR APPROACH
Anesthesia: Spinal | Site: Hip | Laterality: Left

## 2024-01-02 MED ORDER — MIDAZOLAM HCL 2 MG/2ML IJ SOLN
INTRAMUSCULAR | Status: AC
  Filled 2024-01-02: qty 2

## 2024-01-02 MED ORDER — FENTANYL CITRATE (PF) 100 MCG/2ML IJ SOLN: 25.0000 ug | INTRAMUSCULAR | Status: DC | PRN

## 2024-01-02 MED ORDER — OXYCODONE HCL 5 MG/5ML PO SOLN: 5.0000 mg | Freq: Once | ORAL | Status: AC | PRN

## 2024-01-02 MED ORDER — CEFAZOLIN SODIUM-DEXTROSE 2-4 GM/100ML-% IV SOLN
INTRAVENOUS | Status: AC
Start: 2024-01-02 — End: 2024-01-02
  Filled 2024-01-02: qty 100

## 2024-01-02 MED ORDER — 0.9 % SODIUM CHLORIDE (POUR BTL) OPTIME
TOPICAL | Status: DC | PRN
  Administered 2024-01-02: 500 mL

## 2024-01-02 MED ORDER — BUPIVACAINE HCL (PF) 0.5 % IJ SOLN
INTRAMUSCULAR | Status: DC | PRN
  Administered 2024-01-02: 2.8 mL via INTRATHECAL

## 2024-01-02 MED ORDER — ACETAMINOPHEN 10 MG/ML IV SOLN
INTRAVENOUS | Status: AC
  Filled 2024-01-02: qty 100

## 2024-01-02 MED ORDER — CELECOXIB 200 MG PO CAPS
ORAL_CAPSULE | ORAL | Status: AC
  Filled 2024-01-02: qty 2

## 2024-01-02 MED ORDER — TRANEXAMIC ACID-NACL 1000-0.7 MG/100ML-% IV SOLN
INTRAVENOUS | Status: AC
  Filled 2024-01-02: qty 100

## 2024-01-02 MED ORDER — GABAPENTIN 300 MG PO CAPS
ORAL_CAPSULE | ORAL | Status: AC
Start: 2024-01-02 — End: 2024-01-02
  Filled 2024-01-02: qty 1

## 2024-01-02 MED ORDER — FENTANYL CITRATE (PF) 100 MCG/2ML IJ SOLN
INTRAMUSCULAR | Status: DC | PRN
  Administered 2024-01-02 (×2): 25 ug via INTRAVENOUS

## 2024-01-02 MED ORDER — GABAPENTIN 300 MG PO CAPS
300.0000 mg | ORAL_CAPSULE | Freq: Once | ORAL | Status: AC
  Administered 2024-01-02: 300 mg via ORAL

## 2024-01-02 MED ORDER — PHENYLEPHRINE HCL-NACL 20-0.9 MG/250ML-% IV SOLN
INTRAVENOUS | Status: AC
  Filled 2024-01-02: qty 250

## 2024-01-02 MED ORDER — CEFAZOLIN SODIUM-DEXTROSE 2-4 GM/100ML-% IV SOLN
2.0000 g | Freq: Four times a day (QID) | INTRAVENOUS | Status: AC
  Administered 2024-01-02 (×2): 2 g via INTRAVENOUS
  Filled 2024-01-02: qty 100

## 2024-01-02 MED ORDER — PROPOFOL 10 MG/ML IV BOLUS
INTRAVENOUS | Status: AC
  Filled 2024-01-02: qty 20

## 2024-01-02 MED ORDER — PHENYLEPHRINE HCL-NACL 20-0.9 MG/250ML-% IV SOLN
INTRAVENOUS | Status: DC | PRN
Start: 2024-01-02 — End: 2024-01-02
  Administered 2024-01-02: 25 ug/min via INTRAVENOUS

## 2024-01-02 MED ORDER — CHLORHEXIDINE GLUCONATE 0.12 % MT SOLN
15.0000 mL | Freq: Once | OROMUCOSAL | Status: AC
  Administered 2024-01-02: 15 mL via OROMUCOSAL

## 2024-01-02 MED ORDER — ACETAMINOPHEN 10 MG/ML IV SOLN
1000.0000 mg | Freq: Four times a day (QID) | INTRAVENOUS | Status: DC
  Administered 2024-01-02 – 2024-01-03 (×3): 1000 mg via INTRAVENOUS
  Filled 2024-01-02 (×2): qty 100

## 2024-01-02 MED ORDER — ACETAMINOPHEN 10 MG/ML IV SOLN
INTRAVENOUS | Status: DC | PRN
  Administered 2024-01-02: 1000 mg via INTRAVENOUS

## 2024-01-02 MED ORDER — FENTANYL CITRATE (PF) 100 MCG/2ML IJ SOLN
INTRAMUSCULAR | Status: AC
  Filled 2024-01-02: qty 2

## 2024-01-02 MED ORDER — TRANEXAMIC ACID-NACL 1000-0.7 MG/100ML-% IV SOLN
1000.0000 mg | INTRAVENOUS | Status: AC
  Administered 2024-01-02: 1000 mg via INTRAVENOUS

## 2024-01-02 MED ORDER — CHLORHEXIDINE GLUCONATE 0.12 % MT SOLN
OROMUCOSAL | Status: AC
Start: 2024-01-02 — End: 2024-01-02
  Filled 2024-01-02: qty 15

## 2024-01-02 MED ORDER — SODIUM CHLORIDE 0.9 % IR SOLN
Status: DC | PRN
  Administered 2024-01-02: 3000 mL

## 2024-01-02 MED ORDER — DEXAMETHASONE SOD PHOSPHATE PF 10 MG/ML IJ SOLN
8.0000 mg | Freq: Once | INTRAMUSCULAR | Status: AC
  Administered 2024-01-02: 8 mg via INTRAVENOUS

## 2024-01-02 MED ORDER — PROPOFOL 10 MG/ML IV BOLUS
INTRAVENOUS | Status: DC | PRN
  Administered 2024-01-02: 30 mg via INTRAVENOUS

## 2024-01-02 MED ORDER — SURGIPHOR WOUND IRRIGATION SYSTEM - OPTIME: TOPICAL | Status: DC | PRN

## 2024-01-02 MED ORDER — OXYCODONE HCL 5 MG PO TABS
ORAL_TABLET | ORAL | Status: AC
  Filled 2024-01-02: qty 1

## 2024-01-02 MED ORDER — OXYCODONE HCL 5 MG PO TABS
5.0000 mg | ORAL_TABLET | Freq: Once | ORAL | Status: AC | PRN
  Administered 2024-01-02: 5 mg via ORAL

## 2024-01-02 MED ORDER — MIDAZOLAM HCL 5 MG/5ML IJ SOLN
INTRAMUSCULAR | Status: DC | PRN
  Administered 2024-01-02: 1 mg via INTRAVENOUS
  Administered 2024-01-02 (×2): .5 mg via INTRAVENOUS

## 2024-01-02 MED ORDER — ACETAMINOPHEN 10 MG/ML IV SOLN: 1000.0000 mg | Freq: Four times a day (QID) | INTRAVENOUS | Status: DC

## 2024-01-02 MED ORDER — CEFAZOLIN SODIUM-DEXTROSE 2-4 GM/100ML-% IV SOLN
2.0000 g | INTRAVENOUS | Status: AC
  Administered 2024-01-02: 2 g via INTRAVENOUS

## 2024-01-02 MED ORDER — TRANEXAMIC ACID-NACL 1000-0.7 MG/100ML-% IV SOLN
1000.0000 mg | Freq: Once | INTRAVENOUS | Status: AC
  Administered 2024-01-02: 1000 mg via INTRAVENOUS

## 2024-01-02 MED ORDER — LACTATED RINGERS IV SOLN: INTRAVENOUS | Status: DC

## 2024-01-02 MED ORDER — ORAL CARE MOUTH RINSE: 15.0000 mL | Freq: Once | OROMUCOSAL | Status: AC

## 2024-01-02 MED ORDER — PROPOFOL 500 MG/50ML IV EMUL
INTRAVENOUS | Status: DC | PRN
  Administered 2024-01-02: 30 ug/kg/min via INTRAVENOUS

## 2024-01-02 MED ORDER — CHLORHEXIDINE GLUCONATE 4 % EX SOLN: 60.0000 mL | Freq: Once | CUTANEOUS | Status: DC

## 2024-01-02 MED ORDER — ENSURE PRE-SURGERY PO LIQD
296.0000 mL | Freq: Once | ORAL | Status: AC
  Administered 2024-01-02: 296 mL via ORAL
  Filled 2024-01-02: qty 296

## 2024-01-02 MED ORDER — ONDANSETRON HCL 4 MG/2ML IJ SOLN
INTRAMUSCULAR | Status: DC | PRN
  Administered 2024-01-02: 4 mg via INTRAVENOUS

## 2024-01-02 MED ORDER — CELECOXIB 200 MG PO CAPS
400.0000 mg | ORAL_CAPSULE | Freq: Once | ORAL | Status: AC
  Administered 2024-01-02: 400 mg via ORAL

## 2024-01-02 MED ORDER — PROPOFOL 1000 MG/100ML IV EMUL
INTRAVENOUS | Status: AC
  Filled 2024-01-02: qty 100

## 2024-01-02 SURGICAL SUPPLY — 47 items
BLADE CLIPPER SURG (BLADE) IMPLANT
BLADE SAW 90X25X1.19 OSCILLAT (BLADE) ×1 IMPLANT
BRUSH SCRUB EZ PLAIN DRY (MISCELLANEOUS) ×1 IMPLANT
DRAPE INCISE IOBAN 66X60 STRL (DRAPES) ×1 IMPLANT
DRAPE SHEET LG 3/4 BI-LAMINATE (DRAPES) ×1 IMPLANT
DRSG AQUACEL AG ADV 3.5X14 (GAUZE/BANDAGES/DRESSINGS) ×1 IMPLANT
DRSG MEPILEX SACRM 8.7X9.8 (GAUZE/BANDAGES/DRESSINGS) ×1 IMPLANT
DRSG TEGADERM 4X4.75 (GAUZE/BANDAGES/DRESSINGS) ×1 IMPLANT
DURAPREP 26ML APPLICATOR (WOUND CARE) ×2 IMPLANT
ELECT CAUTERY BLADE 6.4 (BLADE) ×1 IMPLANT
ELECTRODE REM PT RTRN 9FT ADLT (ELECTROSURGICAL) ×1 IMPLANT
EVACUATOR 1/8 PVC DRAIN (DRAIN) ×1 IMPLANT
GAUZE XEROFORM 1X8 LF (GAUZE/BANDAGES/DRESSINGS) ×1 IMPLANT
GLOVE BIOGEL M STRL SZ7.5 (GLOVE) ×3 IMPLANT
GLOVE BIOGEL PI IND STRL 7.0 (GLOVE) ×1 IMPLANT
GLOVE SRG 8 PF TXTR STRL LF DI (GLOVE) ×1 IMPLANT
GLOVE SURG SYN 7.0 PF PI (GLOVE) ×3 IMPLANT
GOWN STRL REUS W/ TWL LRG LVL3 (GOWN DISPOSABLE) ×2 IMPLANT
GOWN TOGA ZIPPER T7+ PEEL AWAY (MISCELLANEOUS) ×1 IMPLANT
HANDLE YANKAUER SUCT OPEN TIP (MISCELLANEOUS) ×1 IMPLANT
HEAD FEM STD 32X+1 STRL (Hips) IMPLANT
HOLDER FOLEY CATH W/STRAP (MISCELLANEOUS) ×1 IMPLANT
HOOD PEEL AWAY T7 (MISCELLANEOUS) ×1 IMPLANT
KIT PEG BOARD PINK (KITS) ×1 IMPLANT
KIT TURNOVER KIT A (KITS) ×1 IMPLANT
LINER MARATHON 10D 32MMX450 (Hips) IMPLANT
MANIFOLD NEPTUNE II (INSTRUMENTS) ×2 IMPLANT
NS IRRIG 500ML POUR BTL (IV SOLUTION) ×1 IMPLANT
PACK HIP PROSTHESIS (MISCELLANEOUS) ×1 IMPLANT
PENCIL SMOKE EVACUATOR COATED (MISCELLANEOUS) ×1 IMPLANT
PIN SECT CUP 50MM (Hips) IMPLANT
PIN STEIN THRED 5/32 (Pin) ×1 IMPLANT
SOL .9 NS 3000ML IRR UROMATIC (IV SOLUTION) ×1 IMPLANT
SOLN STERILE WATER BTL 1000 ML (IV SOLUTION) ×1 IMPLANT
SOLUTION IRRIG SURGIPHOR (IV SOLUTION) ×1 IMPLANT
SPONGE DRAIN TRACH 4X4 STRL 2S (GAUZE/BANDAGES/DRESSINGS) ×1 IMPLANT
STAPLER SKIN PROX 35W (STAPLE) ×1 IMPLANT
STEM FEM ACTIS HIGH SZ3 (Stem) IMPLANT
SUT ETHIBOND #5 BRAIDED 30INL (SUTURE) ×1 IMPLANT
SUT VIC AB 0 CT1 36 (SUTURE) ×2 IMPLANT
SUT VIC AB 1 CT1 36 (SUTURE) ×2 IMPLANT
SUT VIC AB 2-0 CT1 TAPERPNT 27 (SUTURE) ×1 IMPLANT
TAPE CLOTH 3X10 WHT NS LF (GAUZE/BANDAGES/DRESSINGS) ×1 IMPLANT
TIP FAN IRRIG PULSAVAC PLUS (DISPOSABLE) ×1 IMPLANT
TOWEL OR 17X26 4PK STRL BLUE (TOWEL DISPOSABLE) IMPLANT
TRAP FLUID SMOKE EVACUATOR (MISCELLANEOUS) ×1 IMPLANT
TRAY FOLEY MTR SLVR 16FR STAT (SET/KITS/TRAYS/PACK) ×1 IMPLANT

## 2024-01-02 NOTE — Anesthesia Procedure Notes (Signed)
 Spinal  Patient location during procedure: OR Start time: 01/02/2024 7:28 AM End time: 01/02/2024 7:38 AM Reason for block: surgical anesthesia Staffing Performed: anesthesiologist  Anesthesiologist: Chesley Lendia CROME, MD Resident/CRNA: Claudene Cornet, CRNA Performed by: Claudene Cornet, CRNA Authorized by: Chesley Lendia CROME, MD   Preanesthetic Checklist Completed: patient identified, IV checked, site marked, risks and benefits discussed, surgical consent, monitors and equipment checked, pre-op evaluation and timeout performed Spinal Block Patient position: sitting Prep: DuraPrep Patient monitoring: heart rate, cardiac monitor, continuous pulse ox and blood pressure Approach: midline Location: L3-4 Injection technique: single-shot Needle Needle type: Sprotte  Needle gauge: 24 G Needle length: 9 cm Assessment Sensory level: T4 Events: CSF return Additional Notes First two attempts by [employer] were unsuccessful.  Next attempt by Chesley MD was successful.

## 2024-01-02 NOTE — Evaluation (Signed)
 Physical Therapy Evaluation Patient Details Name: Tiffany Cole MRN: [id number] DOB: May 09, 1928 Today's Date: 01/02/2024  History of Present Illness  Patient is a 89 year old s/p Left total hip arthroplasty. WBAT. Posterior hip precautions.  Clinical Impression  Patient received sleeping in recovery room. She wakes to voice and agrees to PT assessment. Patient required min A for bed mobility min a to stand with RW. Min +2 for step pivot to [facility] and back to bed. She is requiring increased cues for safety and hip precautions. Patient will continue to benefit from skilled PT to improve functional independence and safety.           If plan is discharge home, recommend the following: A little help with walking and/or transfers;A little help with bathing/dressing/bathroom   Can travel by private vehicle    yes    Equipment Recommendations None recommended by PT  Recommendations for Other Services       Functional Status Assessment Patient has had a recent decline in their functional status and demonstrates the ability to make significant improvements in function in a reasonable and predictable amount of time.     Precautions / Restrictions Precautions Precautions: Posterior Hip Recall of Precautions/Restrictions: Impaired Restrictions Weight Bearing Restrictions Per Provider Order: Yes LLE Weight Bearing Per Provider Order: Weight bearing as tolerated      Mobility  Bed Mobility Overal bed mobility: Needs Assistance Bed Mobility: Supine to Sit, Sit to Supine     Supine to sit: Min assist Sit to supine: Min assist        Transfers Overall transfer level: Needs assistance Equipment used: Rolling walker (2 wheels) Transfers: Sit to/from Stand, Bed to chair/wheelchair/BSC Sit to Stand: Min assist, Mod assist, +2 physical assistance   Step pivot transfers: Min assist, Mod assist, +2 physical assistance       General transfer comment: + 2 for safety, lines, sequencing     Ambulation/Gait                  Stairs            Wheelchair Mobility     Tilt Bed    Modified Rankin (Stroke Patients Only)       Balance Overall balance assessment: Needs assistance Sitting-balance support: Feet supported Sitting balance-Leahy Scale: Good     Standing balance support: Bilateral upper extremity supported, During functional activity, Reliant on assistive device for balance Standing balance-Leahy Scale: Fair                               Pertinent Vitals/Pain Pain Assessment Pain Assessment: Faces Faces Pain Scale: Hurts little more Pain Location: L hip Pain Descriptors / Indicators: Discomfort, Sore Pain Intervention(s): Monitored during session, Repositioned    Home Living Family/patient expects to be discharged to:: Private residence Living Arrangements: Other relatives Available Help at Discharge: Family;Available PRN/intermittently Type of Home: House Home Access: Stairs to enter Entrance Stairs-Rails: Left Entrance Stairs-Number of Steps: 5 Alternate Level Stairs-Number of Steps: flight Home Layout: Two level;Able to live on main level with bedroom/bathroom Home Equipment: Rolling Walker (2 wheels)      Prior Function Prior Level of Function : Independent/Modified Independent             Mobility Comments: was not using AD prior to admission, nephew lives with her but he works during the day ADLs Comments: independent     Extremity/Trunk Assessment   Upper  Extremity Assessment Upper Extremity Assessment: Defer to OT evaluation    Lower Extremity Assessment Lower Extremity Assessment: LLE deficits/detail LLE Coordination: decreased gross motor    Cervical / Trunk Assessment Cervical / Trunk Assessment: Normal  Communication   Communication Communication: No apparent difficulties    Cognition Arousal: Alert Behavior During Therapy: WFL for tasks assessed/performed   PT - Cognitive  impairments: No apparent impairments                         Following commands: Impaired Following commands impaired: Follows one step commands inconsistently, Follows one step commands with increased time     Cueing Cueing Techniques: Verbal cues     General Comments      Exercises     Assessment/Plan    PT Assessment Patient needs continued PT services  PT Problem List Decreased strength;Decreased range of motion;Decreased activity tolerance;Decreased balance;Decreased mobility;Decreased coordination;Decreased cognition;Decreased knowledge of use of DME;Decreased safety awareness;Decreased knowledge of precautions;Pain       PT Treatment Interventions DME instruction;Gait training;Stair training;Functional mobility training;Therapeutic activities;Therapeutic exercise;Balance training;Neuromuscular re-education;Cognitive remediation;Patient/family education    PT Goals (Current goals can be found in the Care Plan section)  Acute Rehab PT Goals Patient Stated Goal: return home PT Goal Formulation: With patient Time For Goal Achievement: 01/05/24 Potential to Achieve Goals: Good    Frequency BID     Co-evaluation PT/OT/SLP Co-Evaluation/Treatment: Yes Reason for Co-Treatment: Necessary to address cognition/behavior during functional activity;For patient/therapist safety;To address functional/ADL transfers PT goals addressed during session: Mobility/safety with mobility;Balance;Proper use of DME         AM-PAC PT 6 Clicks Mobility  Outcome Measure Help needed turning from your back to your side while in a flat bed without using bedrails?: A Little Help needed moving from lying on your back to sitting on the side of a flat bed without using bedrails?: A Little Help needed moving to and from a bed to a chair (including a wheelchair)?: A Little Help needed standing up from a chair using your arms (e.g., wheelchair or bedside chair)?: A Little Help needed to  walk in hospital room?: A Lot Help needed climbing 3-5 steps with a railing? : A Lot 6 Click Score: 16    End of Session   Activity Tolerance: Patient tolerated treatment well Patient left: in bed;with call bell/phone within reach Nurse Communication: Mobility status PT Visit Diagnosis: Other abnormalities of gait and mobility (R26.89);Muscle weakness (generalized) (M62.81);Difficulty in walking, not elsewhere classified (R26.2);Unsteadiness on feet (R26.81);Pain Pain - Right/Left: Left Pain - part of body: Hip    Time: 8475-8454 PT Time Calculation (min) (ACUTE ONLY): 21 min   Charges:   PT Evaluation $PT Eval Moderate Complexity: 1 Mod   PT General Charges $$ ACUTE PT VISIT: 1 Visit         Damico Partin, PT, GCS 01/02/24,3:54 PM

## 2024-01-02 NOTE — Op Note (Signed)
 OPERATIVE NOTE  DATE OF SURGERY:  01/02/2024  PATIENT NAME:  Tiffany Cole   DOB: Jul 27, 1928  MRN: [id number]  PRE-OPERATIVE DIAGNOSIS: Degenerative arthrosis of the left hip, primary  POST-OPERATIVE DIAGNOSIS:  Same  PROCEDURE:  Left total hip arthroplasty  SURGEON:  Lynwood SHAUNNA Mardee Mickey. M.D.  ASSISTANT:  Juliana Costa, PA-C (present and scrubbed throughout the case, critical for assistance with exposure, retraction, instrumentation, and closure)  ANESTHESIA: spinal  ESTIMATED BLOOD LOSS: 100 mL  FLUIDS REPLACED: 800 mL of crystalloid  DRAINS: 2 medium Hemovac drains  IMPLANTS UTILIZED: DePuy size 3 high offset Actis femoral stem, 50 mm OD Pinnacle 100 acetabular component, +4 mm 10 degree Pinnacle Marathon polyethylene insert, and a 32 mm CoCr +1 mm hip ball  INDICATIONS FOR SURGERY: MAIAH SINNING is a 89 y.o. year old female with a long history of progressive hip and groin  pain. X-rays demonstrated severe degenerative changes. The patient had not seen any significant improvement despite conservative nonsurgical intervention. After discussion of the risks and benefits of surgical intervention, the patient expressed understanding of the risks benefits and agree with plans for total hip arthroplasty.   The risks, benefits, and alternatives were discussed at length including but not limited to the risks of infection, bleeding, nerve injury, stiffness, blood clots, the need for revision surgery, limb length inequality, dislocation, cardiopulmonary complications, among others, and they were willing to proceed.  PROCEDURE IN DETAIL: The patient was brought into the operating room and, after adequate spinal anesthesia was achieved, the patient was placed in a right lateral decubitus position. Axillary roll was placed and all bony prominences were well-padded. The patient's left hip was cleaned and prepped with alcohol and DuraPrep and draped in the usual sterile fashion. A timeout was  performed as per usual protocol. A lateral curvilinear incision was made gently curving towards the posterior superior iliac spine. The IT band was incised in line with the skin incision and the fibers of the gluteus maximus were split in line. The piriformis tendon was identified, skeletonized, and incised at its insertion to the proximal femur and reflected posteriorly. A T type posterior capsulotomy was performed. Prior to dislocation of the femoral head, a threaded Steinmann pin was inserted through a separate stab incision into the pelvis superior to the acetabulum and bent in the form of a stylus so as to assess limb length and hip offset throughout the procedure. The femoral head was then dislocated posteriorly. Inspection of the femoral head demonstrated severe degenerative changes with full-thickness loss of articular cartilage. The femoral neck cut was performed using an oscillating saw. The anterior capsule was elevated off of the femoral neck using a periosteal elevator. Attention was then directed to the acetabulum. The remnant of the labrum was excised using electrocautery. Inspection of the acetabulum also demonstrated significant degenerative changes. The acetabulum was reamed in sequential fashion up to a 49 mm diameter. Good punctate bleeding bone was encountered. A 50 mm Pinnacle 100 acetabular component was positioned and impacted into place. Good scratch fit was appreciated. A +4 mm neutral polyethylene trial was inserted.  Attention was then directed to the proximal femur.  Femoral broaches were inserted in a sequential fashion up to a size 3 broach. Calcar region was planed and a trial reduction was performed using a high offset neck and a 32 mm hip ball with a +1 mm neck length.  Reasonably good stability was noted but it was elected to trial with a +4 mm 10  degree polyethylene trial with a high side position at the 4 o'clock position.  Good equalization of limb lengths and hip offset was  appreciated and excellent stability was noted both anteriorly and posteriorly. Trial components were removed. The acetabular shell was irrigated with copious amounts of normal saline with antibiotic solution and suctioned dry. A +4 mm 10 degree Pinnacle Marathon polyethylene insert was positioned with the high side at the 4 o'clock position and impacted into place. Next, a size 3 high offset Actis femoral stem was positioned and impacted into place. Excellent scratch fit was appreciated. A trial reduction was again performed with a 32 mm hip ball with a +1 mm neck length. Again, good equalization of limb lengths was appreciated and excellent stability appreciated both anteriorly and posteriorly. The hip was then dislocated and the trial hip ball was removed. The Morse taper was cleaned and dried. A 32 mm cobalt chrome hip ball with a +1 mm neck length was placed on the trunnion and impacted into place. The hip was then reduced and placed through range of motion. Excellent stability was appreciated both anteriorly and posteriorly.  The wound was irrigated with copious amounts of normal saline followed by 450 ml of Surgiphor and suctioned dry. Good hemostasis was appreciated. The posterior capsulotomy was repaired using #5 Ethibond. Piriformis tendon was reapproximated to the undersurface of the gluteus medius tendon using #5 Ethibond. The IT band was reapproximated using interrupted sutures of #1 Vicryl. Subcutaneous tissue was approximated using first #0 Vicryl followed by #2-0 Vicryl. The skin was closed with skin staples.  The patient tolerated the procedure well and was transported to the recovery room in stable condition.   Lynwood SHAUNNA Mardee Mickey., M.D.

## 2024-01-02 NOTE — Progress Notes (Signed)
 Patient is not able to walk the distance required to go the bathroom, or he/she is unable to safely negotiate stairs required to access the bathroom.  A 3in1 BSC will alleviate this problem   Amenda Duclos P. Angie Fava M.D.

## 2024-01-02 NOTE — Evaluation (Signed)
 Occupational Therapy Evaluation Patient Details Name: Tiffany Cole MRN: [id number] DOB: 1928/05/01 Today's Date: 01/02/2024   History of Present Illness   Patient is a 89 year old s/p Left total hip arthroplasty. WBAT. Posterior hip precautions.     Clinical Impressions Pt seen for OT evaluation and co-tx with PT this date, POD#0 from above surgery. Pt was independent in all ADL prior to surgery and is eager to return to PLOF with less pain and improved safety and independence. She reports that she is hosting Thanksgiving on Saturday for her family. Pt currently requires MIN A +2 for bed mobility and ADL transfers to/from [facility] with RW and MOD VC for redirecting and maintaining precautions. Set up for seated pericare. Anticipate MIN-MOD A for LB dressing while in seated position due to pain and limited AROM of L hip with precautions in place. Pt instructed in posterior total hip precautions and will benefit from additional skilled OT services to address how to maintain precautions, self care skills, falls prevention strategies, home/routines modifications, DME/AE for LB bathing and dressing tasks, compression stocking mgt strategies, and car transfer techniques. Pt would benefit from additional instruction in self care skills and techniques to help maintain precautions with or without assistive devices to support recall and carryover prior to discharge. Anticipate pt will benefit from skilled OT services upon discharge.       If plan is discharge home, recommend the following:   A lot of help with walking and/or transfers;A lot of help with bathing/dressing/bathroom;Assistance with cooking/housework;Assist for transportation;Help with stairs or ramp for entrance     Functional Status Assessment   Patient has had a recent decline in their functional status and demonstrates the ability to make significant improvements in function in a reasonable and predictable amount of time.     Equipment  Recommendations   None recommended by OT     Recommendations for Other Services         Precautions/Restrictions   Precautions Precautions: Posterior Hip Recall of Precautions/Restrictions: Impaired Precaution/Restrictions Comments: Needs handouts on posterior hip precautions Restrictions Weight Bearing Restrictions Per Provider Order: Yes LLE Weight Bearing Per Provider Order: Weight bearing as tolerated     Mobility Bed Mobility Overal bed mobility: Needs Assistance Bed Mobility: Supine to Sit, Sit to Supine     Supine to sit: Min assist Sit to supine: Min assist        Transfers Overall transfer level: Needs assistance Equipment used: Rolling walker (2 wheels) Transfers: Sit to/from Stand, Bed to chair/wheelchair/BSC Sit to Stand: Min assist, Mod assist, +2 physical assistance     Step pivot transfers: Min assist, Mod assist, +2 physical assistance     General transfer comment: + 2 for safety, lines, sequencing      Balance Overall balance assessment: Needs assistance Sitting-balance support: Feet supported Sitting balance-Leahy Scale: Good     Standing balance support: Bilateral upper extremity supported, During functional activity, Reliant on assistive device for balance Standing balance-Leahy Scale: Fair                             ADL either performed or assessed with clinical judgement   ADL Overall ADL's : Needs assistance/impaired     Grooming: Set up;Wash/dry hands;Sitting               Lower Body Dressing: Moderate assistance;Minimal assistance;Sit to/from stand Lower Body Dressing Details (indicate cue type and reason): anticipated Toilet Transfer: BSC/3in1;Rolling  walker (2 wheels);Stand-pivot;Minimal assistance;Moderate assistance;+2 for physical assistance   Toileting- Clothing Manipulation and Hygiene: Sitting/lateral lean;Set up;Supervision/safety       Functional mobility during ADLs: Minimal  assistance;Moderate assistance;Rolling walker (2 wheels);Cueing for safety;Cueing for sequencing       Vision         Perception         Praxis         Pertinent Vitals/Pain Pain Assessment Pain Assessment: Faces Faces Pain Scale: Hurts little more Pain Location: L hip Pain Descriptors / Indicators: Discomfort, Sore Pain Intervention(s): Monitored during session, Repositioned     Extremity/Trunk Assessment Upper Extremity Assessment Upper Extremity Assessment: Overall WFL for tasks assessed   Lower Extremity Assessment Lower Extremity Assessment: Defer to PT evaluation;LLE deficits/detail LLE Deficits / Details: s/p THA LLE Coordination: decreased gross motor   Cervical / Trunk Assessment Cervical / Trunk Assessment: Normal   Communication Communication Communication: No apparent difficulties   Cognition Arousal: Alert Behavior During Therapy: WFL for tasks assessed/performed Cognition: No apparent impairments             OT - Cognition Comments: a bit groggy, decr attention requiring some cues                 Following commands: Impaired Following commands impaired: Follows one step commands inconsistently, Follows one step commands with increased time     Cueing  General Comments   Cueing Techniques: Verbal cues;Tactile cues      Exercises Other Exercises Other Exercises: Pt edu in posterior hip precautions, pillows placed once returned to supine, required MOD VC for maintaining posterior hip precautions during toileting and transfers to redirect to task and maintain.   Shoulder Instructions      Home Living Family/patient expects to be discharged to:: Private residence Living Arrangements: Other relatives Available Help at Discharge: Family;Available PRN/intermittently Type of Home: House Home Access: Stairs to enter Entrance Stairs-Number of Steps: 5 Entrance Stairs-Rails: Left Home Layout: Two level;Able to live on main level with  bedroom/bathroom Alternate Level Stairs-Number of Steps: flight   Bathroom Shower/Tub: Walk-in shower   Bathroom Toilet: Handicapped height     Home Equipment: [employer] (2 wheels)          Prior Functioning/Environment Prior Level of Function : Independent/Modified Independent             Mobility Comments: was not using AD prior to admission, nephew lives with her but he works during the day ADLs Comments: independent    OT Problem List: Decreased strength;Decreased range of motion;Decreased safety awareness;Impaired balance (sitting and/or standing);Decreased knowledge of use of DME or AE;Decreased knowledge of precautions;Pain   OT Treatment/Interventions: Self-care/ADL training;Therapeutic exercise;Therapeutic activities;DME and/or AE instruction;Patient/family education;Balance training      OT Goals(Current goals can be found in the care plan section)   Acute Rehab OT Goals Patient Stated Goal: go home tomorrow OT Goal Formulation: With patient Time For Goal Achievement: 01/16/24 Potential to Achieve Goals: Good ADL Goals Pt Will Perform Lower Body Dressing: with modified independence;with adaptive equipment;sitting/lateral leans;sit to/from stand (maintaining hip precautions, LRAD) Pt Will Transfer to Toilet: with modified independence;ambulating (LRAD, maintaining hip precautions) Pt Will Perform Toileting - Clothing Manipulation and hygiene: with modified independence;sitting/lateral leans;sit to/from stand (maintaining hip precautions, LRAD) Additional ADL Goal #1: Pt will verbalize 3/3 posterior hip precautions and how to maintain during LB ADL and ADL transfers independently.   OT Frequency:  Min 2X/week    Co-evaluation PT/OT/SLP Co-Evaluation/Treatment: Yes Reason for  Co-Treatment: Necessary to address cognition/behavior during functional activity;For patient/therapist safety;To address functional/ADL transfers PT goals addressed during session:  Mobility/safety with mobility;Balance;Proper use of DME OT goals addressed during session: ADL's and self-care;Proper use of Adaptive equipment and DME      AM-PAC OT 6 Clicks Daily Activity     Outcome Measure Help from another person eating meals?: None Help from another person taking care of personal grooming?: A Little Help from another person toileting, which includes using toliet, bedpan, or urinal?: A Lot Help from another person bathing (including washing, rinsing, drying)?: A Lot Help from another person to put on and taking off regular upper body clothing?: A Little Help from another person to put on and taking off regular lower body clothing?: A Lot 6 Click Score: 16   End of Session Equipment Utilized During Treatment: Rolling walker (2 wheels) Nurse Communication: Mobility status  Activity Tolerance: Patient tolerated treatment well Patient left: in bed;with call bell/phone within reach;with SCD's reapplied;Other (comment) (pillows in place as wedge)  OT Visit Diagnosis: Other abnormalities of gait and mobility (R26.89);Pain Pain - Right/Left: Left Pain - part of body: Hip                Time: 8469-8455 OT Time Calculation (min): 14 min Charges:  OT General Charges $OT Visit: 1 Visit OT Evaluation $OT Eval Moderate Complexity: 1 Mod  Warren SAUNDERS., MPH, MS, OTR/L ascom [phone] 01/02/24, 5:02 PM

## 2024-01-02 NOTE — Interval H&P Note (Signed)
 History and Physical Interval Note:  01/02/2024 6:06 AM  Tiffany Cole  has presented today for surgery, with the diagnosis of Primary osteoarthritis of left hip.  The various methods of treatment have been discussed with the patient and family. After consideration of risks, benefits and other options for treatment, the patient has consented to  Procedure(s): ARTHROPLASTY, HIP, TOTAL,POSTERIOR APPROACH (Left) as a surgical intervention.  The patient's history has been reviewed, patient examined, no change in status, stable for surgery.  I have reviewed the patient's chart and labs.  Questions were answered to the patient's satisfaction.     Earlie Arciga P Doyel Mulkern

## 2024-01-02 NOTE — Transfer of Care (Signed)
 Immediate Anesthesia Transfer of Care Note  Patient: Tiffany Cole  Procedure(s) Performed: ARTHROPLASTY, HIP, TOTAL,POSTERIOR APPROACH (Left: Hip)  Patient Location: PACU  Anesthesia Type:Spinal  Level of Consciousness: awake, drowsy, and patient cooperative  Airway & Oxygen Therapy: Patient Spontanous Breathing and Patient connected to face mask oxygen  Post-op Assessment: Report given to RN and Post -op Vital signs reviewed and stable  Post vital signs: Reviewed and stable  Last Vitals:  Vitals Value Taken Time  BP 111/55 01/02/24 10:57  Temp    Pulse 71 01/02/24 10:59  Resp 14 01/02/24 10:59  SpO2 99 % 01/02/24 10:59  Vitals shown include unfiled device data.  Last Pain:  Vitals:   01/02/24 0631  TempSrc: Tympanic         Complications: No notable events documented.

## 2024-01-02 NOTE — Anesthesia Preprocedure Evaluation (Signed)
 Anesthesia Evaluation  Patient identified by MRN, date of birth, ID band Patient awake    Reviewed: Allergy & Precautions, NPO status , Patient's Chart, lab work & pertinent test results  History of Anesthesia Complications Negative for: history of anesthetic complications  Airway Mallampati: III  TM Distance: >3 FB Neck ROM: full    Dental no notable dental hx.    Pulmonary neg pulmonary ROS   Pulmonary exam normal        Cardiovascular hypertension, On Medications Normal cardiovascular exam  Echo reviewed   Neuro/Psych  PSYCHIATRIC DISORDERS Anxiety     negative neurological ROS     GI/Hepatic negative GI ROS, Neg liver ROS,,,  Endo/Other  negative endocrine ROS    Renal/GU negative Renal ROS  negative genitourinary   Musculoskeletal   Abdominal   Peds  Hematology negative hematology ROS (+)   Anesthesia Other Findings Past Medical History: No date: Actinic keratosis No date: Arthritis No date: Arthrosis of hip 11/05/2014: Chest pain with high risk for cardiac etiology 12/01/2014: Heart palpitations No date: Hypertension No date: Kidney stones 07/07/2014: Pure hypercholesterolemia 09/12/2023: SCC (squamous cell carcinoma)     Comment:  right lower leg anterior - well differentiated - treated              with ED&C will recheck No date: Skin cancer 08/26/2012: 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                Left pretibial mid. 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 Right lower leg 07/23/2012: Squamous cell  carcinoma of right lower leg     Comment:  Well differentiated SCC with superficial infiltration.               Right pretibial 02/27/2019: Squamous cell carcinoma of right lower leg     Comment:  SCCis, hypertrophic. Right distal lateral pretibial. 02/27/2019: Squamous cell carcinoma of right lower leg     Comment:  Well differentiated SCC. Right distal medial pretibial. 10/01/2006: Squamous cell carcinoma of skin     Comment:  Well differentiated SCC with superficial infiltration.               Inferior lateral pretibial. 04/10/2019: Squamous cell carcinoma of skin     Comment:  Well differentiated SCC with superficial infiltration.               Left distal anterior thigh. 07/17/2019: Squamous cell carcinoma of skin     Comment:  Left dorsum hand. WD SCC, deep margin involved. 06/06/2021: Squamous cell carcinoma of skin     Comment:  Right Anterior Pretibial, EDC 02/09/2022: Squamous cell carcinoma of skin     Comment:  Left mid medial pretibial - EDC 12/11/2023: Squamous cell carcinoma of skin     Comment:  left lateral lower leg, EDC  Past Surgical History: No date: ANKLE SURGERY No date: APPENDECTOMY 06/02/2022: CYSTOSCOPY/URETEROSCOPY/HOLMIUM LASER/STENT PLACEMENT;  Right     Comment:  Procedure: CYSTOSCOPY/URETEROSCOPY/HOLMIUM LASER/STENT  PLACEMENT;  Surgeon: Francisca Redell BROCKS, MD;  Location:               [facility] ORS;  Service: Urology;  Laterality: Right; 05/25/2016: EXTRACORPOREAL SHOCK WAVE LITHOTRIPSY; Right     Comment:  Procedure: EXTRACORPOREAL SHOCK WAVE LITHOTRIPSY (ESWL);              Surgeon: Rosina Riis, MD;  Location: [facility] ORS;                Service: Urology;  Laterality: Right; 10/11/2017: EXTRACORPOREAL SHOCK WAVE LITHOTRIPSY; Left     Comment:  Procedure: EXTRACORPOREAL SHOCK WAVE LITHOTRIPSY (ESWL);              Surgeon: Riis Rosina, MD;  Location: [facility] ORS;                Service: Urology;  Laterality: Left; 01/13/2021: EXTRACORPOREAL  SHOCK WAVE LITHOTRIPSY; Right     Comment:  Procedure: EXTRACORPOREAL SHOCK WAVE LITHOTRIPSY (ESWL);              Surgeon: Francisca Redell BROCKS, MD;  Location: [facility] ORS;                Service: Urology;  Laterality: Right; 2016: JOINT REPLACEMENT; Left     Comment:  knee No date: SHOULDER ARTHROSCOPY; Right No date: TONSILLECTOMY No date: TUBAL LIGATION  BMI    Body Mass Index: 23.40 kg/m      Reproductive/Obstetrics negative OB ROS                              Anesthesia Physical Anesthesia Plan  ASA: 2  Anesthesia Plan: Spinal   Post-op Pain Management: Toradol IV (intra-op)*, Ofirmev  IV (intra-op)* and Regional block*   Induction: Intravenous  PONV Risk Score and Plan: 2 and Propofol  infusion and TIVA  Airway Management Planned: Natural Airway and Nasal Cannula  Additional Equipment:   Intra-op Plan:   Post-operative Plan:   Informed Consent: I have reviewed the patients History and Physical, chart, labs and discussed the procedure including the risks, benefits and alternatives for the proposed anesthesia with the patient or authorized representative who has indicated his/her understanding and acceptance.     Dental Advisory Given  Plan Discussed with: Anesthesiologist, CRNA and Surgeon  Anesthesia Plan Comments: (Patient reports no bleeding problems and no anticoagulant use.  Plan for spinal with backup GA  Patient consented for risks of anesthesia including but not limited to:  - adverse reactions to medications - damage to eyes, teeth, lips or other oral mucosa - nerve damage due to positioning  - risk of bleeding, infection and or nerve damage from spinal that could lead to paralysis - risk of headache or failed spinal - damage to teeth, lips or other oral mucosa - sore throat or hoarseness - damage to heart, brain, nerves, lungs, other parts of body or loss of life  Patient voiced understanding and assent.)         Anesthesia Quick Evaluation

## 2024-01-03 ENCOUNTER — Telehealth: Payer: Self-pay

## 2024-01-03 DIAGNOSIS — M1612 Unilateral primary osteoarthritis, left hip: Secondary | ICD-10-CM | POA: Diagnosis not present

## 2024-01-03 MED ORDER — OXYCODONE HCL 5 MG PO TABS: 10.0000 mg | ORAL_TABLET | ORAL | Status: DC | PRN

## 2024-01-03 MED ORDER — MENTHOL 3 MG MT LOZG: 1.0000 | LOZENGE | OROMUCOSAL | Status: DC | PRN

## 2024-01-03 MED ORDER — PHENOL 1.4 % MT LIQD: 1.0000 | OROMUCOSAL | Status: DC | PRN

## 2024-01-03 MED ORDER — MECLIZINE HCL 25 MG PO TABS: 25.0000 mg | ORAL_TABLET | Freq: Three times a day (TID) | ORAL | Status: DC | PRN

## 2024-01-03 MED ORDER — ASPIRIN 81 MG PO CHEW: 81.0000 mg | CHEWABLE_TABLET | Freq: Two times a day (BID) | ORAL | Status: AC

## 2024-01-03 MED ORDER — OXYCODONE HCL 5 MG PO TABS: 5.0000 mg | ORAL_TABLET | ORAL | 0 refills | Status: AC | PRN

## 2024-01-03 MED ORDER — CELECOXIB 200 MG PO CAPS
200.0000 mg | ORAL_CAPSULE | Freq: Two times a day (BID) | ORAL | Status: DC
  Administered 2024-01-03: 200 mg via ORAL
  Filled 2024-01-03: qty 1

## 2024-01-03 MED ORDER — CELECOXIB 200 MG PO CAPS: 200.0000 mg | ORAL_CAPSULE | Freq: Two times a day (BID) | ORAL | 1 refills | Status: AC

## 2024-01-03 MED ORDER — ASPIRIN 81 MG PO CHEW
81.0000 mg | CHEWABLE_TABLET | Freq: Two times a day (BID) | ORAL | Status: DC
  Administered 2024-01-03: 81 mg via ORAL
  Filled 2024-01-03: qty 1

## 2024-01-03 MED ORDER — ONDANSETRON HCL 4 MG PO TABS: 4.0000 mg | ORAL_TABLET | Freq: Four times a day (QID) | ORAL | Status: DC | PRN

## 2024-01-03 MED ORDER — SODIUM CHLORIDE 0.9 % IV SOLN: INTRAVENOUS | Status: DC

## 2024-01-03 MED ORDER — PANTOPRAZOLE SODIUM 40 MG PO TBEC
40.0000 mg | DELAYED_RELEASE_TABLET | Freq: Two times a day (BID) | ORAL | Status: DC
  Administered 2024-01-03: 40 mg via ORAL
  Filled 2024-01-03: qty 1

## 2024-01-03 MED ORDER — BISACODYL 10 MG RE SUPP: 10.0000 mg | Freq: Every day | RECTAL | Status: DC | PRN

## 2024-01-03 MED ORDER — DIPHENHYDRAMINE HCL 12.5 MG/5ML PO ELIX: 12.5000 mg | ORAL_SOLUTION | ORAL | Status: DC | PRN

## 2024-01-03 MED ORDER — HYDROMORPHONE HCL 1 MG/ML IJ SOLN: 0.5000 mg | INTRAMUSCULAR | Status: DC | PRN

## 2024-01-03 MED ORDER — FERROUS SULFATE 325 (65 FE) MG PO TABS
325.0000 mg | ORAL_TABLET | Freq: Two times a day (BID) | ORAL | Status: DC
  Administered 2024-01-03: 325 mg via ORAL
  Filled 2024-01-03: qty 1

## 2024-01-03 MED ORDER — FLEET ENEMA RE ENEM: 1.0000 | ENEMA | Freq: Once | RECTAL | Status: DC | PRN

## 2024-01-03 MED ORDER — ACETAMINOPHEN 325 MG PO TABS: 325.0000 mg | ORAL_TABLET | Freq: Four times a day (QID) | ORAL | Status: DC | PRN

## 2024-01-03 MED ORDER — METOCLOPRAMIDE HCL 5 MG PO TABS
10.0000 mg | ORAL_TABLET | Freq: Three times a day (TID) | ORAL | Status: DC
  Administered 2024-01-03: 10 mg via ORAL
  Filled 2024-01-03: qty 2

## 2024-01-03 MED ORDER — TRAMADOL HCL 50 MG PO TABS: 50.0000 mg | ORAL_TABLET | ORAL | 0 refills | Status: AC | PRN

## 2024-01-03 MED ORDER — TRAMADOL HCL 50 MG PO TABS: 50.0000 mg | ORAL_TABLET | ORAL | Status: DC | PRN

## 2024-01-03 MED ORDER — ESCITALOPRAM OXALATE 10 MG PO TABS
10.0000 mg | ORAL_TABLET | Freq: Every day | ORAL | Status: DC
  Administered 2024-01-03: 10 mg via ORAL
  Filled 2024-01-03: qty 1

## 2024-01-03 MED ORDER — AMLODIPINE BESYLATE 5 MG PO TABS
2.5000 mg | ORAL_TABLET | Freq: Every day | ORAL | Status: DC
  Administered 2024-01-03: 2.5 mg via ORAL
  Filled 2024-01-03: qty 1

## 2024-01-03 MED ORDER — SENNOSIDES-DOCUSATE SODIUM 8.6-50 MG PO TABS
1.0000 | ORAL_TABLET | Freq: Two times a day (BID) | ORAL | Status: DC
  Administered 2024-01-03: 1 via ORAL
  Filled 2024-01-03: qty 1

## 2024-01-03 MED ORDER — ONDANSETRON HCL 4 MG/2ML IJ SOLN: 4.0000 mg | Freq: Four times a day (QID) | INTRAMUSCULAR | Status: DC | PRN

## 2024-01-03 MED ORDER — OXYCODONE HCL 5 MG PO TABS: 5.0000 mg | ORAL_TABLET | ORAL | Status: DC | PRN

## 2024-01-03 MED ORDER — ALUM & MAG HYDROXIDE-SIMETH 200-200-20 MG/5ML PO SUSP: 30.0000 mL | ORAL | Status: DC | PRN

## 2024-01-03 MED ORDER — MAGNESIUM HYDROXIDE 400 MG/5ML PO SUSP
30.0000 mL | Freq: Every day | ORAL | Status: DC
  Filled 2024-01-03: qty 30

## 2024-01-03 NOTE — Telephone Encounter (Signed)
 Author placed a call to Devere to coordinate her arrival with additional PT education for caregiver and patient. DTR anticipated arrival at around 11:00 today.   10:34 AM, 01/03/24 Peggye JAYSON Linear, PT, DPT Physical Therapist - [facility]  [phone] [facility])

## 2024-01-03 NOTE — Anesthesia Postprocedure Evaluation (Signed)
 Anesthesia Post Note  Patient: Tiffany Cole  Procedure(s) Performed: ARTHROPLASTY, HIP, TOTAL,POSTERIOR APPROACH (Left: Hip)  Patient location during evaluation: PACU Anesthesia Type: Spinal Level of consciousness: awake and alert Pain management: pain level controlled Vital Signs Assessment: post-procedure vital signs reviewed and stable Respiratory status: spontaneous breathing, nonlabored ventilation, respiratory function stable and patient connected to nasal cannula oxygen Cardiovascular status: blood pressure returned to baseline and stable Postop Assessment: no apparent nausea or vomiting Anesthetic complications: no   No notable events documented.   Last Vitals:  Vitals:   01/03/24 0442 01/03/24 0734  BP: 101/70 (!) 113/54  Pulse: 61 65  Resp: 18 16  Temp: 36.8 C 37.1 C  SpO2: 96% 96%    Last Pain:  Vitals:   01/03/24 0732  TempSrc:   PainSc: 0-No pain                 Debby Mines

## 2024-01-03 NOTE — Plan of Care (Signed)

## 2024-01-03 NOTE — Progress Notes (Signed)
 Subjective: 1 Day Post-Op Procedure(s) (LRB): ARTHROPLASTY, HIP, TOTAL,POSTERIOR APPROACH (Left) Patient reports pain as mild.   Patient is well, and has had no acute complaints or problems Plan is to go Home after hospital stay. Negative for chest pain and shortness of breath Fever: no Gastrointestinal: Negative for nausea and vomiting  Objective: Vital signs in last 24 hours: Temp:  [97 F (36.1 C)-98.3 F (36.8 C)] 98.3 F (36.8 C) (11/27 0442) Pulse Rate:  [52-72] 61 (11/27 0442) Resp:  [8-25] 18 (11/27 0442) BP: (97-161)/(44-83) 101/70 (11/27 0442) SpO2:  [92 %-100 %] 96 % (11/27 0442) Weight:  [63.8 kg] 63.8 kg (11/26 0631)  Intake/Output from previous day:  Intake/Output Summary (Last 24 hours) at 01/03/2024 0605 Last data filed at 01/02/2024 1700 Gross per 24 hour  Intake 1100 ml  Output 595 ml  Net 505 ml    Intake/Output this shift: No intake/output data recorded.  Labs: No results for input(s): HGB in the last 72 hours. No results for input(s): WBC, RBC, HCT, PLT in the last 72 hours. No results for input(s): NA, K, CL, CO2, BUN, CREATININE, GLUCOSE, CALCIUM in the last 72 hours. No results for input(s): LABPT, INR in the last 72 hours.   EXAM General - Patient is Alert and Oriented Extremity - Neurovascular intact Sensation intact distally Dorsiflexion/Plantar flexion intact Compartment soft Dressing/Incision - clean, dry, with the Hemovac tubing removed with no complication.  The Hemovac tubing was intact on removal. Motor Function - intact, moving foot and toes well on exam.   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 Left pretibial   Squamous cell carcinoma of left lower leg 09/07/2014   Well differentiated SCC with superficial infiltration.  Left pretibial   Squamous cell carcinoma of left lower leg 01/01/2017   Well differentiated SCC with superficial infiltration.  Left pretibial mid.   Squamous cell carcinoma of right lower leg 01/07/2008   Well differentiated SCC. Right lower leg   Squamous cell carcinoma of right lower leg 07/23/2012   Well differentiated SCC with superficial infiltration. Right pretibial   Squamous cell carcinoma of right lower leg 02/27/2019   SCCis, hypertrophic. Right distal lateral pretibial.   Squamous cell carcinoma of right lower leg 02/27/2019   Well differentiated SCC. Right distal medial pretibial.   Squamous cell carcinoma of skin 10/01/2006   Well differentiated SCC with superficial infiltration. Inferior lateral pretibial.   Squamous cell carcinoma of skin 04/10/2019   Well differentiated SCC with superficial infiltration. Left distal anterior thigh.   Squamous cell carcinoma of skin 07/17/2019   Left dorsum hand. WD SCC, deep margin involved.   Squamous cell carcinoma of skin 06/06/2021   Right Anterior Pretibial, EDC   Squamous cell carcinoma of skin 02/09/2022   Left mid medial pretibial - EDC   Squamous cell carcinoma of skin 12/11/2023   left lateral lower leg, EDC    Assessment/Plan: 1 Day Post-Op Procedure(s) (LRB): ARTHROPLASTY, HIP, TOTAL,POSTERIOR APPROACH (Left) Principal Problem:   Hx  of total hip arthroplasty, left  Estimated body mass index is 23.4 kg/m as calculated from the following:   Height as of this encounter: 5' 5 (1.651 m).   Weight as of this encounter: 63.8 kg. Advance diet Up with therapy D/C IV fluids Discharge home with home health  DVT Prophylaxis - Aspirin , Foot Pumps, and  TED hose Weight-Bearing as tolerated to left leg  Krystal Doyne, PA-C Orthopaedic Surgery 01/03/2024, 6:05 AM

## 2024-01-03 NOTE — Care Management Obs Status (Signed)
 MEDICARE OBSERVATION STATUS NOTIFICATION   Patient Details  Name: Tiffany Cole MRN: [id number] Date of Birth: October 25, 1928   Medicare Observation Status Notification Given:  Yes Verbal with daughter, [name] at [phone].   [name] 01/03/2024, 12:05 PM

## 2024-01-03 NOTE — Progress Notes (Signed)
 Physical Therapy Treatment Patient Details Name: Tiffany Cole MRN: [id number] DOB: 02/02/1929 Today's Date: 01/03/2024   History of Present Illness Patient is a 89 year old s/p Left total hip arthroplasty. WBAT. Posterior hip precautions. Hx Rt THA ~ 15 years ago per patient.    PT Comments  Pt seen for PT session #2. DTR at bedside, pt's pain remains at goal, and reminder knot tied around pt's knee is helping pt remember her leg crossing precautions. Reviewed handout with DTR, car transfers, issued gait belt for home, LBD assist. Explained we used the pre-surgical stairs technique today, which DTR was already assisting with prior to admission. All PT goals met at this time. Hip precautions will require assist from family. Pt ready for DC from PT standpoint. Still waiting on BSC.     If plan is discharge home, recommend the following: A little help with walking and/or transfers;A little help with bathing/dressing/bathroom   Can travel by private vehicle        Equipment Recommendations  BSC/3in1    Recommendations for Other Services       Precautions / Restrictions Precautions Precautions: Posterior Hip Precaution Booklet Issued: Yes (comment) Recall of Precautions/Restrictions: Impaired Precaution/Restrictions Comments: will need regular reminders from family Restrictions LLE Weight Bearing Per Provider Order: Weight bearing as tolerated     Mobility  Bed Mobility Overal bed mobility: Needs Assistance Bed Mobility: Supine to Sit     Supine to sit: Contact guard, HOB elevated     General bed mobility comments: Left EOB    Transfers Overall transfer level: Needs assistance Equipment used: Rolling walker (2 wheels) Transfers: Sit to/from Stand Sit to Stand: Supervision (cues for safe transitioned)           General transfer comment: more trusting of RW    Ambulation/Gait Ambulation/Gait assistance: Contact guard assist, Supervision Gait Distance (Feet): 140  Feet Assistive device: Rolling walker (2 wheels) Gait Pattern/deviations: Step-through pattern Gait velocity: 0.36m/s     General Gait Details: gait more neutral this session, 50% reduction in Rt trunk lean compared to earlier, moving faster and better RW height   Stairs Stairs: Yes Stairs assistance: Min assist Stair Management: One rail Right, With cane, Forwards Number of Stairs: 8 General stair comments: 1 railing for 8; 4 steps wiht QC, 4 steps with HHA   Wheelchair Mobility     Tilt Bed    Modified Rankin (Stroke Patients Only)       Balance       Sitting balance - Comments: falls anxiety with RW                                    Communication    Cognition Arousal: Alert Behavior During Therapy: WFL for tasks assessed/performed   PT - Cognitive impairments: Memory                                Cueing    Exercises Other Exercises Other Exercises: reviewed stairs instructions, hip precautions, LBD, cars transfers with pt adn DTR Devere.    General Comments        Pertinent Vitals/Pain Pain Assessment Pain Assessment: No/denies pain    Home Living Family/patient expects to be discharged to:: Private residence Living Arrangements: Alone  Prior Function            PT Goals (current goals can now be found in the care plan section) Acute Rehab PT Goals Patient Stated Goal: return home PT Goal Formulation: With patient Time For Goal Achievement: 01/05/24 Potential to Achieve Goals: Good Progress towards PT goals: Progressing toward goals    Frequency    BID      PT Plan      Co-evaluation              AM-PAC PT 6 Clicks Mobility   Outcome Measure  Help needed turning from your back to your side while in a flat bed without using bedrails?: A Little Help needed moving from lying on your back to sitting on the side of a flat bed without using bedrails?: A Little Help  needed moving to and from a bed to a chair (including a wheelchair)?: A Little Help needed standing up from a chair using your arms (e.g., wheelchair or bedside chair)?: A Little Help needed to walk in hospital room?: A Little Help needed climbing 3-5 steps with a railing? : A Lot 6 Click Score: 17    End of Session Equipment Utilized During Treatment: Gait belt Activity Tolerance: Patient tolerated treatment well;No increased pain;Patient limited by fatigue Patient left: with call bell/phone within reach;in chair;with chair alarm set;with family/visitor present Nurse Communication: Mobility status PT Visit Diagnosis: Other abnormalities of gait and mobility (R26.89);Muscle weakness (generalized) (M62.81);Difficulty in walking, not elsewhere classified (R26.2);Unsteadiness on feet (R26.81);Pain Pain - Right/Left: Left Pain - part of body: Hip     Time: 1140-1155 PT Time Calculation (min) (ACUTE ONLY): 15 min  Charges:    $Gait Training: 8-22 mins $Therapeutic Activity: 8-22 mins $Self Care/Home Management: 8-22 PT General Charges $$ ACUTE PT VISIT: 1 Visit                    12:16 PM, 01/03/24 Peggye JAYSON Linear, PT, DPT Physical Therapist - [facility]  [phone] (ASCOM)     Sheena Simonis C 01/03/2024, 12:13 PM

## 2024-01-03 NOTE — Discharge Summary (Signed)
 Physician Discharge Summary  Subjective: 1 Day Post-Op Procedure(s) (LRB): ARTHROPLASTY, HIP, TOTAL,POSTERIOR APPROACH (Left) Patient reports pain as mild.   Patient seen in rounds with Dr. Mardee. Patient is well, and has had no acute complaints or problems Patient is ready to go home after physical therapy for home health physical therapy.  Physician Discharge Summary  Patient ID: Tiffany Cole MRN: [id number] DOB/AGE: [birth year] 89 y.o.  Admit date: 01/02/2024 Discharge date: 01/03/2024  Admission Diagnoses:  Discharge Diagnoses:  Principal Problem:   Hx of total hip arthroplasty, left   Discharged Condition: fair  Hospital Course: The patient is postop day 1 from a left total hip arthroplasty.  She is doing well since surgery.  Her vitals are stable.  Her pain is manageable.  She is ready to work with physical therapy this morning before going home.  Treatments: surgery:   Left total hip arthroplasty   SURGEON:  Lynwood SHAUNNA Mardee Mickey. M.D.   ASSISTANT:  Vick Dunk, PA-C (present and scrubbed throughout the case, critical for assistance with exposure, retraction, instrumentation, and closure)   ANESTHESIA: spinal   ESTIMATED BLOOD LOSS: 100 mL   FLUIDS REPLACED: 800 mL of crystalloid   DRAINS: 2 medium Hemovac drains   IMPLANTS UTILIZED: DePuy size 3 high offset Actis femoral stem, 50 mm OD Pinnacle 100 acetabular component, +4 mm 10 degree Pinnacle Marathon polyethylene insert, and a 32 mm CoCr +1 mm hip ball  Discharge Exam: Blood pressure 101/70, pulse 61, temperature 98.3 F (36.8 C), resp. rate 18, height 5' 5 (1.651 m), weight 63.8 kg, SpO2 96%.   Disposition: Discharge disposition: 01-Home or Self Care        Allergies as of 01/03/2024       Reactions   Diazepam  Other (See Comments)   Stops breathing (oversedation)        Medication List     STOP taking these medications    Ibuprofen 200 MG Caps       TAKE these medications     amLODipine  2.5 MG tablet Commonly known as: NORVASC  Take 2.5 mg by mouth every morning.   aspirin  81 MG chewable tablet Chew 1 tablet (81 mg total) by mouth 2 (two) times daily.   celecoxib  200 MG capsule Commonly known as: CELEBREX  Take 1 capsule (200 mg total) by mouth 2 (two) times daily.   dimenhyDRINATE 50 MG tablet Commonly known as: DRAMAMINE Take 50 mg by mouth every 8 (eight) hours as needed for dizziness.   escitalopram  10 MG tablet Commonly known as: LEXAPRO  Take 10 mg by mouth every morning.   mupirocin  ointment 2 % Commonly known as: BACTROBAN  Apply to skin qd-bid to wounds as needed until healed   oxyCODONE  5 MG immediate release tablet Commonly known as: Oxy IR/ROXICODONE  Take 1 tablet (5 mg total) by mouth every 4 (four) hours as needed for moderate pain (pain score 4-6) (pain score 4-6).   traMADol  50 MG tablet Commonly known as: ULTRAM  Take 1-2 tablets (50-100 mg total) by mouth every 4 (four) hours as needed for moderate pain (pain score 4-6).   Womens Multivitamin Tabs Take 1 tablet by mouth daily.               Durable Medical Equipment  (From admission, onward)           Start     Ordered   01/03/24 0611  DME Walker rolling  Once       Question:  Patient needs a walker  to treat with the following condition  Answer:  S/P total hip arthroplasty   01/03/24 0610   01/03/24 0611  DME Bedside commode  Once       Comments: Patient is not able to walk the distance required to go the bathroom, or he/she is unable to safely negotiate stairs required to access the bathroom.  A 3in1 BSC will alleviate this problem  Question:  Patient needs a bedside commode to treat with the following condition  Answer:  S/P total hip arthroplasty   01/03/24 9389            Follow-up Information     Hooten, Lynwood SQUIBB, MD Follow up on 02/19/2024.   Specialty: Orthopedic Surgery Why: at 2:45pm Contact information: [address] [facility]  [place] [address] [phone]                 Signed: [name], [name] 01/03/2024, 6:11 AM   Objective: Vital signs in last 24 hours: Temp:  [97 F (36.1 C)-98.3 F (36.8 C)] 98.3 F (36.8 C) (11/27 0442) Pulse Rate:  [52-72] 61 (11/27 0442) Resp:  [8-25] 18 (11/27 0442) BP: (97-161)/(44-83) 101/70 (11/27 0442) SpO2:  [92 %-100 %] 96 % (11/27 0442) Weight:  [63.8 kg] 63.8 kg (11/26 0631)  Intake/Output from previous day:  Intake/Output Summary (Last 24 hours) at 01/03/2024 0611 Last data filed at 01/02/2024 1700 Gross per 24 hour  Intake 1100 ml  Output 595 ml  Net 505 ml    Intake/Output this shift: No intake/output data recorded.  Labs: No results for input(s): HGB in the last 72 hours. No results for input(s): WBC, RBC, HCT, PLT in the last 72 hours. No results for input(s): NA, K, CL, CO2, BUN, CREATININE, GLUCOSE, CALCIUM in the last 72 hours. No results for input(s): LABPT, INR in the last 72 hours.  EXAM: General - Patient is Alert and Oriented Extremity - Neurovascular intact Sensation intact distally Dorsiflexion/Plantar flexion intact Compartment soft Incision - clean, dry, with the Hemovac tubing removed with no complication Motor Function -plantarflexion and dorsiflexion are intact.  Assessment/Plan: 1 Day Post-Op Procedure(s) (LRB): ARTHROPLASTY, HIP, TOTAL,POSTERIOR APPROACH (Left) Procedure(s) (LRB): ARTHROPLASTY, HIP, TOTAL,POSTERIOR APPROACH (Left) Past Medical History:  Diagnosis Date   Actinic keratosis    Arthritis    Arthrosis of hip    Chest pain with high risk for cardiac etiology 11/05/2014   Heart palpitations 12/01/2014   Hypertension    Kidney stones    Pure hypercholesterolemia 07/07/2014   SCC (squamous cell carcinoma) 09/12/2023   right lower leg anterior - well differentiated - treated with ED&C will recheck   Skin cancer    Squamous cell carcinoma of left lower leg 08/26/2012    Well differentiated SCC. Left pretibial   Squamous cell carcinoma of left lower leg 09/07/2014   Well differentiated SCC with superficial infiltration.  Left pretibial   Squamous cell carcinoma of left lower leg 01/01/2017   Well differentiated SCC with superficial infiltration.  Left pretibial mid.   Squamous cell carcinoma of right lower leg 01/07/2008   Well differentiated SCC. Right lower leg   Squamous cell carcinoma of right lower leg 07/23/2012   Well differentiated SCC with superficial infiltration. Right pretibial   Squamous cell carcinoma of right lower leg 02/27/2019   SCCis, hypertrophic. Right distal lateral pretibial.   Squamous cell carcinoma of right lower leg 02/27/2019   Well differentiated SCC. Right distal medial pretibial.   Squamous cell carcinoma of skin  10/01/2006   Well differentiated SCC with superficial infiltration. Inferior lateral pretibial.   Squamous cell carcinoma of skin 04/10/2019   Well differentiated SCC with superficial infiltration. Left distal anterior thigh.   Squamous cell carcinoma of skin 07/17/2019   Left dorsum hand. WD SCC, deep margin involved.   Squamous cell carcinoma of skin 06/06/2021   Right Anterior Pretibial, EDC   Squamous cell carcinoma of skin 02/09/2022   Left mid medial pretibial - EDC   Squamous cell carcinoma of skin 12/11/2023   left lateral lower leg, EDC   Principal Problem:   Hx of total hip arthroplasty, left  Estimated body mass index is 23.4 kg/m as calculated from the following:   Height as of this encounter: 5' 5 (1.651 m).   Weight as of this encounter: 63.8 kg. Advance diet Up with therapy D/C IV fluids Discharge home with home health Diet - Regular diet Follow up - in 6 weeks Activity - WBAT Disposition - Home Condition Upon Discharge - Stable DVT Prophylaxis - Aspirin  and TED hose  Krystal Doyne, PA-C Orthopaedic Surgery 01/03/2024, 6:11 AM

## 2024-01-03 NOTE — Progress Notes (Signed)
 Physical Therapy Treatment Patient Details Name: Tiffany Cole MRN: [id number] DOB: August 14, 1928 Today's Date: 01/03/2024   History of Present Illness Patient is a 89 year old s/p Left total hip arthroplasty. WBAT. Posterior hip precautions. Hx Rt THA ~ 15 years ago per patient.    PT Comments  Pt in bed on arrival, no pain reported. Pt has minimal recall of therapy evals previous day, including precautions education. Pt requires repeated, clearly stated instructions and cues for success in session. Generally fearful of RW use due to perceived instability, educated on proper use. Pt requires close supervision and repeated cues to prevent breaking hip precautions. Pt uses heavy effort to achieve transfers and AMB, very distracted by short left limb (known prior to surgery) which is functionally a bit worse as a result of postural tendencies and sustained Rt lateral lean. Pt achieves performance of 8 stairs, performed similarly to presurgical pattern, will review with family prior to DC. PT in recliner at end of session, all needs met, pain at goal.    If plan is discharge home, recommend the following: A little help with walking and/or transfers;A little help with bathing/dressing/bathroom   Can travel by private vehicle        Equipment Recommendations  BSC/3in1    Recommendations for Other Services       Precautions / Restrictions Precautions Precautions: Posterior Hip Precaution Booklet Issued: Yes (comment) Recall of Precautions/Restrictions: Impaired Precaution/Restrictions Comments: will need regular reminders from family Restrictions LLE Weight Bearing Per Provider Order: Weight bearing as tolerated     Mobility  Bed Mobility Overal bed mobility: Needs Assistance Bed Mobility: Supine to Sit     Supine to sit: Contact guard, HOB elevated     General bed mobility comments: Left EOB    Transfers Overall transfer level: Needs assistance Equipment used: Rolling walker (2  wheels) Transfers: Sit to/from Stand Sit to Stand: Contact guard assist           General transfer comment: fearful of RW failure    Ambulation/Gait   Gait Distance (Feet): 100 Feet Assistive device: Rolling walker (2 wheels) Gait Pattern/deviations: Step-through pattern       General Gait Details: fairly significant Left short limb, pt indicates a problem prior to surgery   Stairs Stairs: Yes Stairs assistance: Min assist Stair Management: One rail Right, With cane, Forwards Number of Stairs: 8 General stair comments: 1 railing for 8; 4 steps wiht QC, 4 steps with HHA   Wheelchair Mobility     Tilt Bed    Modified Rankin (Stroke Patients Only)       Balance       Sitting balance - Comments: falls anxiety with RW                                    Communication    Cognition Arousal: Alert Behavior During Therapy: WFL for tasks assessed/performed   PT - Cognitive impairments: Memory                                Cueing    Exercises      General Comments        Pertinent Vitals/Pain Pain Assessment Pain Assessment: No/denies pain    Home Living  Prior Function            PT Goals (current goals can now be found in the care plan section) Acute Rehab PT Goals Patient Stated Goal: return home PT Goal Formulation: With patient Time For Goal Achievement: 01/05/24 Potential to Achieve Goals: Good Progress towards PT goals: Progressing toward goals    Frequency    BID      PT Plan      Co-evaluation              AM-PAC PT 6 Clicks Mobility   Outcome Measure  Help needed turning from your back to your side while in a flat bed without using bedrails?: A Little Help needed moving from lying on your back to sitting on the side of a flat bed without using bedrails?: A Little Help needed moving to and from a bed to a chair (including a wheelchair)?: A Little Help  needed standing up from a chair using your arms (e.g., wheelchair or bedside chair)?: A Little Help needed to walk in hospital room?: A Little Help needed climbing 3-5 steps with a railing? : A Lot 6 Click Score: 17    End of Session Equipment Utilized During Treatment: Gait belt Activity Tolerance: Patient tolerated treatment well;No increased pain;Patient limited by fatigue Patient left: with call bell/phone within reach;in chair;with chair alarm set Nurse Communication: Mobility status PT Visit Diagnosis: Other abnormalities of gait and mobility (R26.89);Muscle weakness (generalized) (M62.81);Difficulty in walking, not elsewhere classified (R26.2);Unsteadiness on feet (R26.81);Pain Pain - Right/Left: Left Pain - part of body: Hip     Time: 9090-9048 PT Time Calculation (min) (ACUTE ONLY): 42 min  Charges:    $Gait Training: 8-22 mins $Therapeutic Activity: 8-22 mins $Self Care/Home Management: 8-22 PT General Charges $$ ACUTE PT VISIT: 1 Visit                    10:15 AM, 01/03/24 Peggye JAYSON Linear, PT, DPT Physical Therapist - [facility]  [phone] (ASCOM)     [name] 01/03/2024, 10:10 AM

## 2024-01-04 ENCOUNTER — Encounter: Payer: Self-pay | Admitting: Orthopedic Surgery
